# Patient Record
Sex: Female | Born: 1970 | Race: White | Hispanic: No | Marital: Single | Smoking: Current some day smoker
Health system: Southern US, Community
[De-identification: ages and names within clinical notes are randomized; demographics above are authoritative.]

## PROBLEM LIST (undated history)

## (undated) DIAGNOSIS — C3490 Malignant neoplasm of unspecified part of unspecified bronchus or lung: Secondary | ICD-10-CM

## (undated) DIAGNOSIS — R531 Weakness: Secondary | ICD-10-CM | POA: Insufficient documentation

## (undated) DIAGNOSIS — Z79899 Other long term (current) drug therapy: Secondary | ICD-10-CM | POA: Insufficient documentation

## (undated) DIAGNOSIS — R110 Nausea: Secondary | ICD-10-CM | POA: Insufficient documentation

## (undated) DIAGNOSIS — R5383 Other fatigue: Secondary | ICD-10-CM | POA: Insufficient documentation

## (undated) DIAGNOSIS — F17210 Nicotine dependence, cigarettes, uncomplicated: Secondary | ICD-10-CM | POA: Insufficient documentation

## (undated) DIAGNOSIS — C7970 Secondary malignant neoplasm of unspecified adrenal gland: Secondary | ICD-10-CM | POA: Insufficient documentation

## (undated) DIAGNOSIS — Z5111 Encounter for antineoplastic chemotherapy: Secondary | ICD-10-CM | POA: Insufficient documentation

## (undated) DIAGNOSIS — E039 Hypothyroidism, unspecified: Secondary | ICD-10-CM

## (undated) DIAGNOSIS — R05 Cough: Secondary | ICD-10-CM | POA: Insufficient documentation

## (undated) HISTORY — PX: CHOLECYSTECTOMY: SHX55

## (undated) HISTORY — PX: PORTACATH PLACEMENT: SHX2246

## (undated) HISTORY — DX: Malignant neoplasm of unspecified part of unspecified bronchus or lung: C34.90

---

## 2009-01-14 ENCOUNTER — Inpatient Hospital Stay: Payer: Self-pay | Admitting: General Surgery

## 2010-07-01 ENCOUNTER — Emergency Department: Payer: Self-pay | Admitting: Emergency Medicine

## 2014-04-27 ENCOUNTER — Emergency Department: Payer: Self-pay | Admitting: Emergency Medicine

## 2014-08-07 ENCOUNTER — Emergency Department: Payer: Self-pay | Admitting: Internal Medicine

## 2014-08-08 ENCOUNTER — Ambulatory Visit: Payer: Self-pay | Admitting: Oncology

## 2014-08-08 ENCOUNTER — Inpatient Hospital Stay: Payer: Self-pay | Admitting: Internal Medicine

## 2014-08-08 DIAGNOSIS — C3490 Malignant neoplasm of unspecified part of unspecified bronchus or lung: Secondary | ICD-10-CM

## 2014-08-08 HISTORY — DX: Malignant neoplasm of unspecified part of unspecified bronchus or lung: C34.90

## 2014-08-11 ENCOUNTER — Ambulatory Visit: Payer: Self-pay | Admitting: Internal Medicine

## 2014-08-16 ENCOUNTER — Ambulatory Visit: Payer: Self-pay | Admitting: Oncology

## 2014-08-18 ENCOUNTER — Ambulatory Visit: Payer: Self-pay | Admitting: Oncology

## 2014-08-24 ENCOUNTER — Ambulatory Visit
Admit: 2014-08-24 | Disposition: A | Discharge status: Home / Self Care | Payer: Self-pay | Attending: Oncology | Admitting: Oncology

## 2014-09-09 ENCOUNTER — Ambulatory Visit: Payer: Self-pay | Admitting: Vascular Surgery

## 2014-09-17 LAB — CBC CANCER CENTER
Basophil #: 0 x10 3/mm (ref 0.0–0.1)
Basophil %: 0.6 %
EOS PCT: 2.5 %
Eosinophil #: 0.2 x10 3/mm (ref 0.0–0.7)
HCT: 41.9 % (ref 35.0–47.0)
HGB: 14 g/dL (ref 12.0–16.0)
Lymphocyte #: 1.8 x10 3/mm (ref 1.0–3.6)
Lymphocyte %: 25.6 %
MCH: 30.5 pg (ref 26.0–34.0)
MCHC: 33.5 g/dL (ref 32.0–36.0)
MCV: 91 fL (ref 80–100)
MONO ABS: 0.4 x10 3/mm (ref 0.2–0.9)
Monocyte %: 6 %
NEUTROS PCT: 65.3 %
Neutrophil #: 4.7 x10 3/mm (ref 1.4–6.5)
PLATELETS: 288 x10 3/mm (ref 150–440)
RBC: 4.6 10*6/uL (ref 3.80–5.20)
RDW: 14 % (ref 11.5–14.5)
WBC: 7.2 x10 3/mm (ref 3.6–11.0)

## 2014-09-17 LAB — COMPREHENSIVE METABOLIC PANEL
ALT: 9 U/L — AB
AST: 15 U/L
Albumin: 3.5 g/dL
Alkaline Phosphatase: 75 U/L
Anion Gap: 7 (ref 7–16)
BUN: 9 mg/dL
Bilirubin,Total: 0.5 mg/dL
Calcium, Total: 8.7 mg/dL — ABNORMAL LOW
Chloride: 100 mmol/L — ABNORMAL LOW
Co2: 29 mmol/L
Creatinine: 0.57 mg/dL
EGFR (African American): >60
EGFR (Non-African Amer.): >60
Glucose: 90 mg/dL
POTASSIUM: 3.7 mmol/L
Sodium: 136 mmol/L
TOTAL PROTEIN: 7.2 g/dL

## 2014-09-22 LAB — BASIC METABOLIC PANEL
Anion Gap: 9 (ref 7–16)
BUN: 16 mg/dL
CO2: 31 mmol/L
CREATININE: 0.72 mg/dL
Calcium, Total: 9.9 mg/dL
Chloride: 97 mmol/L — ABNORMAL LOW
GLUCOSE: 109 mg/dL — AB
Potassium: 4.4 mmol/L
SODIUM: 137 mmol/L

## 2014-09-22 LAB — CBC CANCER CENTER
Basophil #: 0 x10 3/mm (ref 0.0–0.1)
Basophil %: 0.5 %
EOS PCT: 0.6 %
Eosinophil #: 0.1 x10 3/mm (ref 0.0–0.7)
HCT: 43 % (ref 35.0–47.0)
HGB: 14.4 g/dL (ref 12.0–16.0)
LYMPHS ABS: 1.4 x10 3/mm (ref 1.0–3.6)
LYMPHS PCT: 17.6 %
MCH: 30.6 pg (ref 26.0–34.0)
MCHC: 33.5 g/dL (ref 32.0–36.0)
MCV: 91 fL (ref 80–100)
MONOS PCT: 0.7 %
Monocyte #: 0.1 x10 3/mm — ABNORMAL LOW (ref 0.2–0.9)
NEUTROS ABS: 6.4 x10 3/mm (ref 1.4–6.5)
Neutrophil %: 80.6 %
Platelet: 210 x10 3/mm (ref 150–440)
RBC: 4.71 10*6/uL (ref 3.80–5.20)
RDW: 13.5 % (ref 11.5–14.5)
WBC: 8 x10 3/mm (ref 3.6–11.0)

## 2014-09-22 LAB — CREATININE, SERUM: Creatine, Serum: 0.72

## 2014-09-24 ENCOUNTER — Ambulatory Visit
Admit: 2014-09-24 | Disposition: A | Discharge status: Home / Self Care | Payer: Self-pay | Attending: Oncology | Admitting: Oncology

## 2014-10-06 LAB — COMPREHENSIVE METABOLIC PANEL
Albumin: 3.5 g/dL
Alkaline Phosphatase: 92 U/L
Anion Gap: 5 — ABNORMAL LOW (ref 7–16)
BUN: 7 mg/dL
Bilirubin,Total: 0.4 mg/dL
CALCIUM: 8.5 mg/dL — AB
CHLORIDE: 103 mmol/L
Co2: 27 mmol/L
Creatinine: 0.59 mg/dL
Glucose: 90 mg/dL
Potassium: 3.8 mmol/L
SGOT(AST): 16 U/L
SGPT (ALT): 13 U/L — ABNORMAL LOW
Sodium: 135 mmol/L
TOTAL PROTEIN: 7.2 g/dL

## 2014-10-06 LAB — CBC CANCER CENTER
Basophil #: 0 x10 3/mm (ref 0.0–0.1)
Basophil %: 0.8 %
EOS ABS: 0.1 x10 3/mm (ref 0.0–0.7)
Eosinophil %: 1.3 %
HCT: 39.9 % (ref 35.0–47.0)
HGB: 13.5 g/dL (ref 12.0–16.0)
Lymphocyte #: 1.7 x10 3/mm (ref 1.0–3.6)
Lymphocyte %: 40.6 %
MCH: 30.7 pg (ref 26.0–34.0)
MCHC: 33.8 g/dL (ref 32.0–36.0)
MCV: 91 fL (ref 80–100)
MONO ABS: 0.6 x10 3/mm (ref 0.2–0.9)
Monocyte %: 13.9 %
Neutrophil #: 1.8 x10 3/mm (ref 1.4–6.5)
Neutrophil %: 43.4 %
Platelet: 357 x10 3/mm (ref 150–440)
RBC: 4.38 10*6/uL (ref 3.80–5.20)
RDW: 14.5 % (ref 11.5–14.5)
WBC: 4.1 x10 3/mm (ref 3.6–11.0)

## 2014-10-13 LAB — CBC CANCER CENTER
BASOS PCT: 1.3 %
Basophil #: 0.1 x10 3/mm (ref 0.0–0.1)
Eosinophil #: 0 x10 3/mm (ref 0.0–0.7)
Eosinophil %: 0.4 %
HCT: 40.2 % (ref 35.0–47.0)
HGB: 13.6 g/dL (ref 12.0–16.0)
LYMPHS ABS: 2.1 x10 3/mm (ref 1.0–3.6)
Lymphocyte %: 30.9 %
MCH: 30.7 pg (ref 26.0–34.0)
MCHC: 33.7 g/dL (ref 32.0–36.0)
MCV: 91 fL (ref 80–100)
MONO ABS: 0.1 x10 3/mm — AB (ref 0.2–0.9)
MONOS PCT: 1.3 %
Neutrophil #: 4.6 x10 3/mm (ref 1.4–6.5)
Neutrophil %: 66.1 %
PLATELETS: 272 x10 3/mm (ref 150–440)
RBC: 4.42 10*6/uL (ref 3.80–5.20)
RDW: 14.3 % (ref 11.5–14.5)
WBC: 6.9 x10 3/mm (ref 3.6–11.0)

## 2014-10-17 ENCOUNTER — Other Ambulatory Visit: Payer: Self-pay | Admitting: Oncology

## 2014-10-17 DIAGNOSIS — C3492 Malignant neoplasm of unspecified part of left bronchus or lung: Secondary | ICD-10-CM

## 2014-10-17 DIAGNOSIS — C3490 Malignant neoplasm of unspecified part of unspecified bronchus or lung: Secondary | ICD-10-CM | POA: Insufficient documentation

## 2014-10-18 ENCOUNTER — Other Ambulatory Visit: Payer: Self-pay | Admitting: Oncology

## 2014-10-24 NOTE — H&P (Signed)
PATIENT NAME:  Tammy Andersen, Tammy Andersen MR#:  [id number] DATE OF BIRTH:  12/13/1970  DATE OF ADMISSION:  08/08/2014  REFERRING PHYSICIAN: Sheryl L. Benjaman Lobe, MD  PRIMARY CARE PRACTITIONER: None.    ADMITTING PHYSICIAN: Juluis Mire, MD   CHIEF COMPLAINT: Coughing up blood of 2 days' duration.   HISTORY OF PRESENT ILLNESS: A 44 year old Caucasian woman with no significant past medical history except for a history of smoking for the past many years,  presents to the Emergency Room with complaints of a dry cough with associated coughing up blood which started about 2 days ago. The patient presented to the Emergency Room yesterday and was evaluated by the ED physician for cough with associated hemoptysis and found to have a left  lung mass on chest x-ray, following which she underwent a CT of the chest which revealed left lung hilar massive lymphadenopathy. She was advised admission yesterday, but she signed [place] and left the hospital. Of note, she was found to be hemodynamically stable and labs including hemoglobin and hematocrit were within normal limits. ED physician yesterday consulted the oncologist on-call, Dr. Grayland Ormond, with whom the patient has an appointment to see in his office tomorrow. Meanwhile, today, she was really worried about if she has further worsening of symptoms; hence, she came to the Emergency Room for further evaluation. Did not have any increased cough or increased hemoptysis today. She does have just minor coughing up of blood and 2 or 3 episodes of a dry cough today. Denies any dizziness. No chest pain. No shortness of breath. No recent fever. No chills. No nausea, no vomiting. No diarrhea. No urinary symptoms. In the Emergency Room, the patient was evaluated by the ED physician again today and was found to be hemodynamically stable, and repeat lab work was unremarkable. There is no further drop in hemoglobin and hematocrit. In view of her significant findings on the  CT chest with a massive left hilar adenopathy and the risk of eminent  hemoptysis, hospitalist service was consulted for further evaluation and management.    PAST MEDICAL HISTORY: No history of hypertension, diabetes, or lung problems. History of smoking.   PAST SURGICAL HISTORY: Cholecystectomy.   HOME MEDICATIONS: None.   ALLERGIES: No known drug allergies.   FAMILY HISTORY: Significant for diabetes and heart disease which runs in the family. No history of any cancers in the family.   SOCIAL HISTORY: She is single and is unemployed. History of smoking 1/2 pack per day for the past many years. Denies any history of alcohol or substance abuse.   REVIEW OF SYSTEMS:   CONSTITUTIONAL: Negative for fever or chills. No fatigue. No generalized weakness.  EYES: Negative for blurred vision, double vision. No pain. No redness. No discharge.  EARS, NOSE, AND THROAT: Negative for tinnitus, ear pain, hearing loss, epistaxis, nasal discharge, or difficulty swallowing.  RESPIRATORY: Positive for dry cough with bouts of hemoptysis for the past 2 days as mentioned in the history of present illness. Denies any wheezing. No sputum production. No painful respirations.  CARDIOVASCULAR: Negative for chest pain, palpitations, dizziness, syncopal episodes, orthopnea, dyspnea on exertion, or pedal edema.  GASTROINTESTINAL: Negative for nausea, vomiting, diarrhea, abdominal pain, hematemesis, melena, rectal bleeding, or GERD symptoms.  GENITOURINARY: Negative for dysuria, frequency, urgency, or hematuria.  HEMATOLOGIC: Negative for anemia, easy bruising, or swollen glands.  INTEGUMENTARY: Negative for acne, skin rash, or lesions.  MUSCULOSKELETAL: Negative for neck pain or shoulder pain. No arthritis. No history of gout.  NEUROLOGICAL:  Negative for focal weakness or numbness. No history of CVA, TIA, or seizure disorder.  PSYCHIATRIC: Negative for anxiety, insomnia, or depression.   PHYSICAL EXAMINATION:  VITAL  SIGNS: Temperature 98.5 degrees Fahrenheit, pulse rate 107 per minute, respirations 20 per minute, blood pressure 139/83, O2 saturation is 95% on room air.  GENERAL: Well-developed, well-nourished, alert and oriented, in no acute distress, comfortably resting in the bed.  HEAD: Atraumatic, normocephalic.  EYES: Pupils are equal, react to light and accommodation. No conjunctival pallor. No icterus. Extraocular movements intact.  NOSE: No drainage. No lesions.  EARS: No drainage. No external lesions.  ORAL CAVITY: No mucosal lesions. No exudates.   NECK: Supple. No JVD. No thyromegaly. No carotid bruit. Range of motion of neck within normal limits.  RESPIRATORY: Good respiratory effort. Not using accessory muscles of respiration. Occasional rhonchi bilaterally present. No rales.  CARDIOVASCULAR: S1, S2 regular. No murmurs, gallops, or clicks. Pulses equal at carotid, femoral, and pedal pulses. No peripheral edema.  GASTROINTESTINAL: Abdomen is soft and nontender. No hepatosplenomegaly. No masses. No rigidity. No guarding. Bowel sounds are present and equal in all 4 quadrants.  GENITOURINARY: Deferred. MUSCULOSKELETAL: No joint tenderness or effusion. Range of motion adequate. Strength and tone equal bilaterally.  SKIN: Inspection within normal limits. No obvious wounds. LYMPHATIC: No cervical lymphadenopathy.   VASCULAR: Good dorsalis pedis and posterior tibial pulses.  NEUROLOGICAL: Alert, awake, and oriented x 3. Cranial nerves II through XII grossly intact. No sensory deficit or motor deficit. Motor strength 5/5 in both upper and lower extremities. DTRs 2+ bilateral and symmetrical.  PSYCHIATRIC: Alert, awake, and oriented x 3. Judgment and insight adequate. Memory and mood within normal limits.   LABORATORY DATA: Serum glucose 79, BUN 6, creatinine 0.67, sodium 135, potassium 3.6, chloride 99, bicarbonate 29, total calcium 8.5, total protein 7.9, albumin 2.7, total bilirubin 0.5, alkaline  phosphatase 92, AST 22, ALT 14. WBC 11.2, hemoglobin 14.0, hematocrit 42.1, platelet count 257,000. Prothrombin time 13.4, INR 1.0.   Chest x-ray done yesterday: A large mass-like area of consolidation within the left perihilar region, may represent pneumonia in the appropriate clinical setting. Left hilar/pulmonary mass is an additional consideration. Recommend further evaluation with chest CT.    CT of the chest with contrast:  1.  Extensive left hilar and mediastinal bulky adenopathy with encasement and narrowing of the left pulmonary artery branches.  2.  Parenchymal changes in the left upper lobe and lingula suggesting possible lymphangitic spread of tumor versus postoperative changes.  3.  Nodules along the left major fissure and small left pleural effusions suggesting malignant pleural disease.  4.  Abnormal left adrenal gland incompletely visualized. Consider PET CT for further evaluation and staging.   ASSESSMENT AND PLAN: A 44 year old Caucasian female with a past medical history significant for active smoking who presents with complaints of dry cough with associated hemoptysis of 2 days' duration, evaluated by the Emergency Department physician and found to have left hilar massive adenopathy concerning for possible malignancy. The patient is hemodynamically stable.  1.  Left lung hilar lymphadenopathy suspicious for malignancy.  2.  Hemoptysis with associated dry cough of 2 days' duration. The patient is hemodynamically stable. Hemoglobin and hematocrit within normal limits. No active hemoptysis at the current time.  3.  Tobacco usage, continuous.   PLAN: Admit to medical floor. Monitor vital signs closely. Monitor for any hemoptysis. Type and screen. Serial CBC q. 6 hours. DuoNebs p.r.n. for shortness of breath or wheezing. Pulmonary consultation and oncologic consultation  requested for further evaluation. Counseled to quit smoking. Offered nicotine replacement treatment. The patient  defers at this time.  Deep vein thrombosis prophylaxis: SCDs. No heparin in view of hemoptysis. GI prophylaxis: PPI.   CODE STATUS: FULL CODE.   TIME SPENT: 50 minutes.    ____________________________ Juluis Mire, MD enr:ts D: 08/08/2014 21:03:26 ET T: 08/08/2014 21:36:52 ET JOB#: [id number]  cc: Juluis Mire, MD, <Dictator> Juluis Mire MD ELECTRONICALLY SIGNED 08/09/2014 12:45

## 2014-10-24 NOTE — Discharge Summary (Signed)
PATIENT NAME:  Tammy Andersen, Tammy Andersen MR#:  [id number] DATE OF BIRTH:  [birth year]  DATE OF ADMISSION:  08/08/2014 DATE OF DISCHARGE:  08/09/2014  ADMITTING DIAGNOSIS: Coughing of blood.   DISCHARGE DIAGNOSES:  1.  Hemoptysis due to likely lung cancer. Her. We will have an outpatient bronchoscopy.  2.  Nicotine addiction.  3.  Left hilar massive lymphadenopathy suspicious for malignancy.   CONSULTANTS: Mariane Duval, MD  PERTINENT LABORATORIES AND EVALUATIONS: Admitting glucose 82, BUN 6, creatinine 0.68, sodium 136, potassium was 3.9, chloride 102, CO2 of 29, calcium 8.7. LFTs were normal except albumin of 2.7. WBC 10.0, hemoglobin 14.2, platelet count was 260,000. INR 1.0. CT scan of showed extensive left hilar and mediastinal bulky adenopathy with encasement and narrowing of the left pulmonary artery branches, parenchymal changes in the left upper lobe and lingula suggesting a possible lymphangitic spread of tumor versus postobstructive changes. Nodule along with left major fissure and small left effusion suggestive of malignant pleural disease. Abnormal left adrenal gland pain incompletely visualize.   HOSPITAL COURSE: Please refer to H and P done by the admitting physician. The patient is a 44 year old white female who presented with 2-day history of coughing up blood. She was evaluated in the ER with chest x-ray as well as a CT scan of the chest. Findings were worrisome for a lung cancer. The patient was initially seen in the ED with these complaints she AMA, and then came back. This time, she was admitted, again. After being hospitalized overnight, the next day when I went to see the patient, she was very anxious to go home. I recommended that she stay until she was seen by the pulmonary doctor. She was seen by Dr. Mortimer Fries. He stated that she will be having a bronchoscopy on Wednesday due to anesthesiology schedule. The patient did not want to wait; she wanted to go home. We strongly recommended that she  follow up for the bronchoscopy on Wednesday. At this time, due to her wishes, the patient is being discharged. She has not had any further hemoptysis.   DISCHARGE MEDICATIONS: Levofloxacin 750 one tablet q. 24 x 5 days, Tylenol 650 q. 4 p.r.n. for pain.   DIET: Regular.   ACTIVITY: As tolerated.   FOLLOWUP: On Wednesday with Dr. Mortimer Fries for bronchoscopy. Follow up with oncology in 1-2 weeks.   TIME SPENT ON THIS DISCHARGE: 35 minutes.    ____________________________ Lafonda Mosses Posey Pronto, MD shp:bm D: 08/10/2014 20:42:07 ET T: 08/11/2014 03:07:48 ET JOB#: [id number]  cc: Chavonne Sforza H. Posey Pronto, MD, <Dictator> Alric Seton MD ELECTRONICALLY SIGNED 08/13/2014 15:55

## 2014-10-24 NOTE — Consult Note (Signed)
Reason for Visit: This 44 year old Female patient presents to the clinic for initial evaluation of  lung cancer .   Referred by Dr. Grayland Ormond.  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 She also had nodules along the left major fissure as well as small left pleural effusion suggesting malignant pleural effusion. CT scan confirmed hypermetabolic activity in these regions as well as left adrenal gland.Fransisco Beau was positive for small cell lung cancer. She's had some slight tinged sputum blood-tinged sputum. She's lost about 30 pounds over the past several months. I've asked to evaluate her for possible palliative radiation therapy.  Past Hx:    Denies:    Cholecystectomy: 14-Jan-2009  Past, Family and Social History:  Past Medical History positive   Past Surgical History cholecystectomy   Family History noncontributory   Social History positive   Social History Comments greater than 50-pack-year smoking history no EtOH abuse history   Additional Past Medical and Surgical History computed by family member today   Allergies:   No Known Allergies:   Home Meds:  Home Medications: Medication Instructions Status  Tussionex [employer] 10 mg-8 mg/5 mL oral suspension, extended release 5 milliliter(s) orally every 12 hours Active   Review of Systems:  General negative    Performance Status (ECOG) 0   Skin negative   Breast negative   Ophthalmologic negative   ENMT negative   Respiratory and Thorax see HPI   Cardiovascular negative   Gastrointestinal negative   Genitourinary negative   Musculoskeletal negative   Neurological negative   Psychiatric negative   Hematology/Lymphatics negative   Endocrine negative   Allergic/Immunologic negative   Review of Systems   denies any weight loss, fatigue, weakness, fever, chills or night sweats. Patient denies any loss of vision, blurred vision. Patient denies any ringing  of the ears or hearing loss. No irregular heartbeat. Patient denies heart murmur or history of fainting. Patient denies any chest pain or pain radiating to her upper extremities. Patient denies any shortness of breath, difficulty breathing at night, cough or hemoptysis. Patient denies any swelling in the lower legs. Patient denies any nausea vomiting, vomiting of blood, or coffee ground material in the vomitus. Patient denies any stomach pain. Patient states has had normal bowel movements no significant constipation or diarrhea. Patient denies any dysuria, hematuria or significant nocturia. Patient denies any problems walking, swelling in the joints or loss of balance. Patient denies any skin changes, loss of hair or loss of weight. Patient denies any excessive worrying or anxiety or significant depression. Patient denies any problems with insomnia. Patient denies excessive thirst, polyuria, polydipsia. Patient denies any swollen glands, patient denies easy bruising or easy bleeding. Patient denies any recent infections, allergies or URI. Patient "s visual fields have not changed significantly in recent time.   Nursing Notes:  Nursing Vital Signs and Chemo Nursing Nursing Notes: *CC Vital Signs Flowsheet:   07-Mar-16 10:29  Temp Temperature 98.1  Pulse Pulse 92  Respirations Respirations 20  SBP SBP 138  DBP DBP 85  Pain Scale (0-10)   0  Pulse Oxi  96  Current Weight (kg) (kg) 88.3  Height (cm) centimeters 157.2  BSA (m2) 1.8   Physical Exam:  General/Skin/HEENT:  General normal   Skin normal   Eyes normal   ENMT normal   Head and Neck normal   Additional PE well-developed female so slightly obese in NAD. T thirst poor state of repair no oral mucosal lesions are identified is clear without evidence of cervical or supraclavicular adenopathy. Lungs are clear to A&P cardiac examination shows regular rate and rhythm. Abdomen is benign.   Breasts/Resp/CV/GI/GU:  Respiratory and Thorax normal   Cardiovascular normal   Gastrointestinal normal   Genitourinary normal   MS/Neuro/Psych/Lymph:  Musculoskeletal normal   Neurological normal   Lymphatics normal   Other Results:  Radiology Results: LabUnknown:    13-Feb-16 11:37, CT Chest With Contrast  PACS Image   CT:  CT Chest With Contrast   REASON FOR EXAM:    cough bloody sputum cp lul mass on xray  COMMENTS:       PROCEDURE: CT  - CT CHEST WITH CONTRAST  - Aug 07 2014 11:37AM     CLINICAL DATA:  left sided shoulder pain and left sided upper CP X 4  days. Worse with movement and coughing. Pt with cough and bloddy  sputum. Chest x-ray from today recommended follow up CT scan. Pt  denies hx of surgery, trauma, or ca. Pt states she has been a pack a  day smoker for 20+ years    EXAM:  CT CHEST WITH CONTRAST    TECHNIQUE:  Multidetector CT imaging of the chest was performed during  intravenous contrast administration.    CONTRAST:  75 mL Omnipaque 350 IV    COMPARISON:  Radiograph from earlier the same day    FINDINGS:  Extensive bulky left hilar, anterior mediastinal, subcarinal, and  right paratracheal adenopathy. The hilar disease results in  narrowing of left pulmonary artery branches. Trace left pleural  effusion. Two nodules along the left major fissure, largest 15 mm  image 26/3. No pericardial effusion. Interstitial  thickening  peripherally in the left upper lobe and lingula with patchy  suprahilar airspace and alveolar opacities. Dependent atelectasis  posteriorly at the left lung base. Right lung is clear. Mild  spondylitic changes in the lower thoracic spine. Sternum intact.  There is mild enlargement of the left adrenal gland with mild  adjacent edematous/inflammatory change, incompletely visualized.  Surgical clips in the gallbladder fossa. Remainder visualized upper  abdomen unremarkable.     IMPRESSION:  1. Extensive left hilar and mediastinal bulky adenopathy, with  encasement and narrowing of left pulmonary artery branches.  2. Parenchymal changes in the left upper lobe and lingula suggesting  possible lymphangitic spread of tumor versus postobstructive change.  3. Nodules along the left major fissure and small left pleural  effusion suggesting malignant pleural disease.  4. Abnormal left adrenal gland, incompletely visualized. Consider  PET-CT for further evaluation and staging.      Electronically Signed    By: Lucrezia Europe M.D.    On: 08/07/2014 12:07         Verified By: Kandis Cocking, M.D.,  Nuclear Med:    [phone] 14:28, PET/CT Staging Lung/Small Cell  PET/CT Staging Lung/Small Cell   REASON FOR EXAM:    initial stage small cell lung cancer  COMMENTS:  PROCEDURE: PET - PET/CT STAGING LUNG/SMALL CELL  - Aug 18 2014  2:28PM     CLINICAL DATA:  Initial treatment strategy for stage IIIB small cell  lung carcinoma.    EXAM:  NUCLEAR MEDICINE PET SKULL BASE TO THIGH    TECHNIQUE:  13.2 mCi F-18 FDG was injected intravenously. Full-ring PET imaging  was performed from the skull base to thigh after the radiotracer. CT  data was obtained and used for attenuation correction and anatomic  localization.    FASTING BLOOD GLUCOSE:  Value: 91 mg/dl    COMPARISON:  CT chest from 08/07/2014.    FINDINGS:  NECK    There is no hypermetabolic lymphadenopathy in the neck.  Diffuse FDG  uptake is identified in both lobes of the thyroid gland may be  related to thyroiditis.    CHEST  Previously described bulky mediastinal and left hilar  lymphadenopathy is markedly hypermetabolic. Representative SUV for  anterior mediastinal lymphadenopathy is 23.9. Subcarinal  lymphadenopathy demonstrates SUV max = 15.9. Left infrahilar disease  demonstrates SUV max = 14.9.    ABDOMEN/PELVIS    1.7 cm left adrenal nodule is markedly hypermetabolic with SUV max =  9.2. Right adrenal gland is normal. Mild fold FDG uptake is  identified in the liver parenchyma, but no discrete or highly  suspicious focal liver lesion is evident. No other evidence for  visceral metastases within the abdomen.    Atherosclerotic calcification is seen in the wall of the abdominal  aorta without aneurysm.    5.4 x 4.6 cm cystic mass identified in the left adnexal space  demonstrates no hypermetabolism on PET imaging.    SKELETON    No focal hypermetabolic activity to suggest skeletal metastasis.     IMPRESSION:  Markedly hypermetabolic lymphadenopathy of the mediastinum and hilum  consistent with the patient's known malignancy. There is associated  hypermetabolic 1.7 cm left adrenal nodule, compatible with  metastatic disease to the abdomen.  5.4 cm left adnexal cystic lesion without hypermetabolism. Pelvic  ultrasound may prove helpful to further evaluate.      Electronically Signed    By: Misty Stanley M.D.    On: 08/18/2014 17:28         Verified By: ERIC A. MANSELL, M.D.,   Relevent Results:   Relevant Scans and Labs PET/CT scan and CT scans reviewed MRI of brain scheduled   Assessment and Plan: Impression:   stage IV small cell lung cancer in 44 year old female Plan:   at this time based on the extensive nature of her disease a single field of radiation would cover X significant amount of normal lung volume. I am concerned about the tumor encroaching the left pulmonary  artery.although I believe in stage IV disease at this point we should go ahead with initiation of systemic chemotherapy evaluate the response in the next month or 2 and possibly that time offer some palliative radiation therapy and a short course. Certainly should she continue to bleed or have major obstruction of her lung could offer palliative radiation therapy sooner. All this was explained in detail to the patient and her family member. I have set her up for 2 month follow-up appointment. She also will have her brain scan this week and will follow up on that. Have discussed the case personally with medical oncology.  I would like to take this opportunity for allowing me to participate in the care of your patient..  Electronic Signatures: Armstead Peaks (MD)  (Signed [phone]  15:13)  Authored: HPI, Diagnosis, Past Hx, PFSH, Allergies, Home Meds, ROS, Nursing Notes, Physical Exam, Other Results, Relevent Results, Encounter Assessment and Plan   Last Updated: 07-Mar-16 15:39 by Armstead Peaks (MD)

## 2014-10-24 NOTE — Op Note (Signed)
PATIENT NAME:  Tammy Andersen, Tammy Andersen MR#:  [id number] DATE OF BIRTH:  1971/05/05  DATE OF PROCEDURE:  09/09/2014  PREOPERATIVE DIAGNOSIS:  Lung cancer with poor venous access.  POSTOPERATIVE DIAGNOSIS:  Lung cancer with poor venous access.  PROCEDURES:  1.  Ultrasound guidance for vascular access, right internal jugular vein.  2.  Fluoroscopic guidance for placement of catheter.  3.  Placement of CT compatible Port-A-Cath, right internal jugular vein.   SURGEON:  Algernon Huxley, M.D.   ANESTHESIA:  Local with moderate conscious sedation.   FLUOROSCOPY TIME:  Less than 1 minute.   CONTRAST:  Zero.   ESTIMATED BLOOD LOSS:  25 mL.   INDICATION FOR PROCEDURE:  A 44 year old female with lung cancer who needs Port-a-Cath for chemotherapy access and durable venous access. Risks and benefits were discussed. Informed consent was obtained.   DESCRIPTION OF THE PROCEDURE:  The patient was brought to the vascular and interventional radiology suite. The right neck and chest were sterilely prepped and draped, and a sterile surgical field was created. Ultrasound was used to help visualize a patent right internal jugular vein. This was then accessed under direct ultrasound guidance without difficulty with a Seldinger needle and a permanent image was recorded. A J-wire was placed. After skin nick and dilatation, the peel-away sheath was then placed over the wire. I then anesthetized an area under the clavicle approximately 2 fingerbreadths. A transverse incision was created and an inferior pocket was created with electrocautery and blunt dissection. The port was then brought onto the field, placed into the pocket and secured to the chest wall with 2 Prolene sutures. The catheter was connected to the port and tunneled from the subclavicular incision to the access site. Fluoroscopic guidance was used to cut the catheter to an appropriate length. The catheter was then placed through the peel-away sheath and the peel-away  sheath was removed. The catheter tip was parked in excellent location in the right atrium just beyond the cavoatrial junction. The pocket was then irrigated with antibiotic-impregnated saline and the wound was closed with a running 3-0 Vicryl and a 4-0 Monocryl. The access incision was closed with a single 4-0 Monocryl. The Huber needle was used to withdraw blood and flush the port with heparinized saline. Dermabond was then placed as a dressing. The patient tolerated the procedure well and was taken to the recovery room in stable condition.    ____________________________ Algernon Huxley, MD jsd:sp D: 09/09/2014 12:26:28 ET T: 09/09/2014 13:41:55 ET JOB#: [id number]  cc: Algernon Huxley, MD, <Dictator> Algernon Huxley MD ELECTRONICALLY SIGNED 09/13/2014 15:03

## 2014-10-27 ENCOUNTER — Ambulatory Visit: Payer: Medicaid Other | Admitting: Oncology

## 2014-10-27 ENCOUNTER — Inpatient Hospital Stay: Payer: Medicaid Other

## 2014-10-27 ENCOUNTER — Ambulatory Visit: Payer: Self-pay | Admitting: *Deleted

## 2014-10-28 ENCOUNTER — Ambulatory Visit: Payer: Medicaid Other

## 2014-10-29 ENCOUNTER — Ambulatory Visit: Payer: Self-pay

## 2014-11-02 ENCOUNTER — Other Ambulatory Visit: Payer: Self-pay | Admitting: Oncology

## 2014-11-02 DIAGNOSIS — C3492 Malignant neoplasm of unspecified part of left bronchus or lung: Secondary | ICD-10-CM

## 2014-11-03 ENCOUNTER — Inpatient Hospital Stay (HOSPITAL_BASED_OUTPATIENT_CLINIC_OR_DEPARTMENT_OTHER): Payer: Medicaid Other | Admitting: Oncology

## 2014-11-03 ENCOUNTER — Inpatient Hospital Stay: Payer: Medicaid Other

## 2014-11-03 ENCOUNTER — Inpatient Hospital Stay: Payer: Medicaid Other | Attending: Oncology

## 2014-11-03 ENCOUNTER — Encounter: Payer: Self-pay | Admitting: Oncology

## 2014-11-03 VITALS — BP 136/95 | HR 88 | Temp 96.5°F | Resp 20 | Wt 193.1 lb

## 2014-11-03 DIAGNOSIS — C3490 Malignant neoplasm of unspecified part of unspecified bronchus or lung: Secondary | ICD-10-CM

## 2014-11-03 DIAGNOSIS — R5383 Other fatigue: Secondary | ICD-10-CM

## 2014-11-03 DIAGNOSIS — Z79899 Other long term (current) drug therapy: Secondary | ICD-10-CM | POA: Diagnosis not present

## 2014-11-03 DIAGNOSIS — R05 Cough: Secondary | ICD-10-CM

## 2014-11-03 DIAGNOSIS — C7970 Secondary malignant neoplasm of unspecified adrenal gland: Secondary | ICD-10-CM

## 2014-11-03 DIAGNOSIS — R531 Weakness: Secondary | ICD-10-CM | POA: Diagnosis not present

## 2014-11-03 DIAGNOSIS — C3492 Malignant neoplasm of unspecified part of left bronchus or lung: Secondary | ICD-10-CM

## 2014-11-03 DIAGNOSIS — F17210 Nicotine dependence, cigarettes, uncomplicated: Secondary | ICD-10-CM | POA: Diagnosis not present

## 2014-11-03 DIAGNOSIS — R110 Nausea: Secondary | ICD-10-CM | POA: Diagnosis not present

## 2014-11-03 DIAGNOSIS — Z5111 Encounter for antineoplastic chemotherapy: Secondary | ICD-10-CM | POA: Diagnosis present

## 2014-11-03 LAB — COMPREHENSIVE METABOLIC PANEL
ALBUMIN: 3.4 g/dL — AB (ref 3.5–5.0)
ALT: 10 U/L — AB (ref 14–54)
AST: 19 U/L (ref 15–41)
Alkaline Phosphatase: 86 U/L (ref 38–126)
Anion gap: 6 (ref 5–15)
BILIRUBIN TOTAL: 0.3 mg/dL (ref 0.3–1.2)
BUN: 9 mg/dL (ref 6–20)
CHLORIDE: 102 mmol/L (ref 101–111)
CO2: 26 mmol/L (ref 22–32)
Calcium: 8.3 mg/dL — ABNORMAL LOW (ref 8.9–10.3)
Creatinine, Ser: 0.67 mg/dL (ref 0.44–1.00)
GFR calc Af Amer: >60 mL/min (ref >60)
GFR calc non Af Amer: >60 mL/min (ref >60)
Glucose, Bld: 161 mg/dL — ABNORMAL HIGH (ref 65–99)
Potassium: 3.4 mmol/L — ABNORMAL LOW (ref 3.5–5.1)
Sodium: 134 mmol/L — ABNORMAL LOW (ref 135–145)
Total Protein: 7 g/dL (ref 6.5–8.1)

## 2014-11-03 LAB — CBC WITH DIFFERENTIAL/PLATELET
BASOS PCT: 1 %
Basophils Absolute: 0.1 10*3/uL (ref 0–0.1)
Eosinophils Absolute: 0.1 10*3/uL (ref 0–0.7)
Eosinophils Relative: 1 %
HEMATOCRIT: 39.9 % (ref 35.0–47.0)
HEMOGLOBIN: 13.3 g/dL (ref 12.0–16.0)
Lymphocytes Relative: 21 %
Lymphs Abs: 2.2 10*3/uL (ref 1.0–3.6)
MCH: 30.5 pg (ref 26.0–34.0)
MCHC: 33.3 g/dL (ref 32.0–36.0)
MCV: 91.7 fL (ref 80.0–100.0)
MONO ABS: 0.6 10*3/uL (ref 0.2–0.9)
Monocytes Relative: 6 %
NEUTROS ABS: 7.6 10*3/uL — AB (ref 1.4–6.5)
Neutrophils Relative %: 71 %
PLATELETS: 314 10*3/uL (ref 150–440)
RBC: 4.36 MIL/uL (ref 3.80–5.20)
RDW: 16.4 % — AB (ref 11.5–14.5)
WBC: 10.6 10*3/uL (ref 3.6–11.0)

## 2014-11-03 MED ORDER — SODIUM CHLORIDE 0.9 % IV SOLN
Freq: Once | INTRAVENOUS | Status: AC
  Administered 2014-11-03: 12:00:00 via INTRAVENOUS
  Filled 2014-11-03: qty 250

## 2014-11-03 MED ORDER — HEPARIN SOD (PORK) LOCK FLUSH 100 UNIT/ML IV SOLN
500.0000 [IU] | Freq: Once | INTRAVENOUS | Status: AC
  Administered 2014-11-03: 500 [IU] via INTRAVENOUS

## 2014-11-03 MED ORDER — HEPARIN SOD (PORK) LOCK FLUSH 100 UNIT/ML IV SOLN
INTRAVENOUS | Status: AC
  Filled 2014-11-03: qty 5

## 2014-11-03 MED ORDER — POTASSIUM CHLORIDE 2 MEQ/ML IV SOLN
Freq: Once | INTRAVENOUS | Status: AC
  Administered 2014-11-03: 12:00:00 via INTRAVENOUS
  Filled 2014-11-03: qty 1000

## 2014-11-03 MED ORDER — SODIUM CHLORIDE 0.9 % IV SOLN
80.0000 mg/m2 | Freq: Once | INTRAVENOUS | Status: AC
  Administered 2014-11-03: 155 mg via INTRAVENOUS
  Filled 2014-11-03: qty 155

## 2014-11-03 MED ORDER — ETOPOSIDE CHEMO INJECTION 1 GM/50ML
80.0000 mg/m2 | Freq: Once | INTRAVENOUS | Status: AC
  Administered 2014-11-03: 160 mg via INTRAVENOUS
  Filled 2014-11-03: qty 8

## 2014-11-03 MED ORDER — PALONOSETRON HCL INJECTION 0.25 MG/5ML
0.2500 mg | Freq: Once | INTRAVENOUS | Status: AC
  Administered 2014-11-03: 0.25 mg via INTRAVENOUS
  Filled 2014-11-03: qty 5

## 2014-11-03 MED ORDER — SODIUM CHLORIDE 0.9 % IV SOLN
Freq: Once | INTRAVENOUS | Status: AC
  Administered 2014-11-03: 14:00:00 via INTRAVENOUS
  Filled 2014-11-03: qty 5

## 2014-11-03 NOTE — Progress Notes (Signed)
Patient reports that she has recently been evicted from her home due to being behind on rent.  [employer] [facility] did try to help patient with her rent but the landlord would not accept payment.  She has an appointment next week with [facility] program and is staying with friends in the mean time.

## 2014-11-04 ENCOUNTER — Inpatient Hospital Stay: Payer: Medicaid Other

## 2014-11-04 VITALS — BP 119/78 | HR 81 | Temp 96.1°F | Resp 20

## 2014-11-04 DIAGNOSIS — C3492 Malignant neoplasm of unspecified part of left bronchus or lung: Secondary | ICD-10-CM

## 2014-11-04 DIAGNOSIS — Z5111 Encounter for antineoplastic chemotherapy: Secondary | ICD-10-CM | POA: Diagnosis not present

## 2014-11-04 MED ORDER — HEPARIN SOD (PORK) LOCK FLUSH 100 UNIT/ML IV SOLN
500.0000 [IU] | Freq: Once | INTRAVENOUS | Status: AC | PRN
  Administered 2014-11-04: 500 [IU]
  Filled 2014-11-04: qty 5

## 2014-11-04 MED ORDER — SODIUM CHLORIDE 0.9 % IV SOLN
Freq: Once | INTRAVENOUS | Status: AC
  Administered 2014-11-04: 14:00:00 via INTRAVENOUS
  Filled 2014-11-04: qty 1000

## 2014-11-04 MED ORDER — SODIUM CHLORIDE 0.9 % IV SOLN
80.0000 mg/m2 | Freq: Once | INTRAVENOUS | Status: AC
  Administered 2014-11-04: 160 mg via INTRAVENOUS
  Filled 2014-11-04: qty 8

## 2014-11-04 MED ORDER — SODIUM CHLORIDE 0.9 % IV SOLN
10.0000 mg | Freq: Once | INTRAVENOUS | Status: AC
  Administered 2014-11-04: 10 mg via INTRAVENOUS
  Filled 2014-11-04: qty 1

## 2014-11-05 ENCOUNTER — Inpatient Hospital Stay: Payer: Medicaid Other

## 2014-11-05 VITALS — BP 122/78 | HR 77 | Temp 97.5°F | Resp 18

## 2014-11-05 DIAGNOSIS — C3492 Malignant neoplasm of unspecified part of left bronchus or lung: Secondary | ICD-10-CM

## 2014-11-05 DIAGNOSIS — Z5111 Encounter for antineoplastic chemotherapy: Secondary | ICD-10-CM | POA: Diagnosis not present

## 2014-11-05 MED ORDER — HEPARIN SOD (PORK) LOCK FLUSH 100 UNIT/ML IV SOLN
500.0000 [IU] | Freq: Once | INTRAVENOUS | Status: AC | PRN
  Administered 2014-11-05: 500 [IU]
  Filled 2014-11-05: qty 5

## 2014-11-05 MED ORDER — SODIUM CHLORIDE 0.9 % IV SOLN
80.0000 mg/m2 | Freq: Once | INTRAVENOUS | Status: AC
  Administered 2014-11-05: 160 mg via INTRAVENOUS
  Filled 2014-11-05: qty 8

## 2014-11-05 MED ORDER — SODIUM CHLORIDE 0.9 % IV SOLN
Freq: Once | INTRAVENOUS | Status: AC
  Administered 2014-11-05: 14:00:00 via INTRAVENOUS
  Filled 2014-11-05: qty 250

## 2014-11-05 MED ORDER — SODIUM CHLORIDE 0.9 % IJ SOLN
10.0000 mL | INTRAMUSCULAR | Status: DC | PRN
  Administered 2014-11-05: 10 mL
  Filled 2014-11-05: qty 10

## 2014-11-05 MED ORDER — SODIUM CHLORIDE 0.9 % IV SOLN
10.0000 mg | Freq: Once | INTRAVENOUS | Status: AC
  Administered 2014-11-05: 10 mg via INTRAVENOUS
  Filled 2014-11-05: qty 1

## 2014-11-23 NOTE — Progress Notes (Signed)
[facility]  Telephone:(336) [phone] Fax:(336) [phone]  ID: Tammy Andersen OB: 21-Dec-1970  MR#: [id number]  KGU#:[id number]  Patient Care Team: Marden Noble, MD as PCP - General (Internal Medicine)  CHIEF COMPLAINT:  Chief Complaint  Patient presents with  . Follow-up    small cell lung cancer  . Chemotherapy    INTERVAL HISTORY: Patient returns to clinic today for further evaluation and consideration of cycle 3 of cisplatin and etoposide. She feels increased weakness and fatigue. Her appetite has improved and she is no longer complaining of nausea.  She continues to have cough and shortness of breath. She has no neurologic complaints. She denies any recent fevers. She has no chest pain. She denies any constipation or diarrhea. She has no urinary complaints. Patient offers no further specific complaints.  REVIEW OF SYSTEMS:   Review of Systems  Constitutional: Positive for malaise/fatigue. Negative for weight loss.  Respiratory: Positive for cough.   Gastrointestinal: Positive for nausea.  Neurological: Positive for weakness.    As per HPI. Otherwise, a complete review of systems is negatve.  PAST MEDICAL HISTORY: Past Medical History  Diagnosis Date  . Lung cancer     PAST SURGICAL HISTORY: Past Surgical History  Procedure Laterality Date  . Cholecystectomy      FAMILY HISTORY No family history on file.     ADVANCED DIRECTIVES:    HEALTH MAINTENANCE: History  Substance Use Topics  . Smoking status: Current Some Day Smoker  . Smokeless tobacco: Never Used  . Alcohol Use: No     Colonoscopy:  PAP:  Bone density:  Lipid panel:  Allergies  Allergen Reactions  . No Known Allergies     Current Outpatient Prescriptions  Medication Sig Dispense Refill  . HYDROcodone-acetaminophen (NORCO/VICODIN) 5-325 MG per tablet Take 1 tablet by mouth every 6 (six) hours as needed for moderate pain.    Marland Kitchen prochlorperazine (COMPAZINE) 10 MG tablet  Take 10 mg by mouth every 6 (six) hours as needed for nausea or vomiting.    . benzonatate (TESSALON) 100 MG capsule Take by mouth 3 (three) times daily as needed for cough.     No current facility-administered medications for this visit.    OBJECTIVE: Filed Vitals:   11/03/14 1122  BP: 136/95  Pulse: 88  Temp: 96.5 F (35.8 C)  Resp: 20     Body mass index is 35.45 kg/(m^2).    ECOG FS:1 - Symptomatic but completely ambulatory  General: Well-developed, well-nourished, no acute distress. Eyes: anicteric sclera. Lungs: Clear to auscultation bilaterally. Heart: Regular rate and rhythm. No rubs, murmurs, or gallops. Abdomen: Soft, nontender, nondistended. No organomegaly noted, normoactive bowel sounds. Musculoskeletal: No edema, cyanosis, or clubbing. Neuro: Alert, answering all questions appropriately. Cranial nerves grossly intact. Skin: No rashes or petechiae noted. Psych: Normal affect.    LAB RESULTS:  Lab Results  Component Value Date   NA 134* 11/03/2014   K 3.4* 11/03/2014   CL 102 11/03/2014   CO2 26 11/03/2014   GLUCOSE 161* 11/03/2014   BUN 9 11/03/2014   CREATININE 0.67 11/03/2014   CALCIUM 8.3* 11/03/2014   PROT 7.0 11/03/2014   ALBUMIN 3.4* 11/03/2014   AST 19 11/03/2014   ALT 10* 11/03/2014   ALKPHOS 86 11/03/2014   BILITOT 0.3 11/03/2014   GFRNONAA >60 11/03/2014   GFRAA >60 11/03/2014    Lab Results  Component Value Date   WBC 10.6 11/03/2014   NEUTROABS 7.6* 11/03/2014   HGB 13.3 11/03/2014  HCT 39.9 11/03/2014   MCV 91.7 11/03/2014   PLT 314 11/03/2014     STUDIES: No results found.  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   PLAN:    1. Small cell lung carcinoma:  Confirmed from bronchoscopy. PET scan and CT scan images reviewed independently consistent with stage IV disease with adrenal metastasis. Patient refused MRI of the brain. Proceed with cycle 3 of 4 of cisplatin plus etoposide today. Return  to clinic in 1 and 2 days for etoposide only and then in 3 weeks for consideration of cycle 4.  Plan to reimage after cycle 4. Patient expressed understanding and was in agreement with this plan. 2. Cough: Continue current prescriptions as prescribed. 3. Weakness and fatigue: Secondary to chemotherapy, monitor. 4. Nausea: Continue current anti-emetic regimen.   Patient expressed understanding and was in agreement with this plan. She also understands that She can call clinic at any time with any questions, concerns, or complaints.   Small cell carcinoma of lung   Staging form: Lung, AJCC 7th Edition     Clinical stage from 10/17/2014: T4, N3, M1 - Signed by Lloyd Huger, MD on 10/17/2014   Lloyd Huger, MD   11/23/2014 8:52 AM

## 2014-11-24 ENCOUNTER — Inpatient Hospital Stay: Payer: Medicaid Other | Attending: Oncology

## 2014-11-24 ENCOUNTER — Inpatient Hospital Stay (HOSPITAL_BASED_OUTPATIENT_CLINIC_OR_DEPARTMENT_OTHER): Payer: Medicaid Other | Admitting: Oncology

## 2014-11-24 ENCOUNTER — Inpatient Hospital Stay: Payer: Medicaid Other

## 2014-11-24 VITALS — BP 104/72 | HR 72 | Resp 20

## 2014-11-24 VITALS — BP 125/84 | HR 82 | Temp 95.6°F | Resp 20 | Wt 194.4 lb

## 2014-11-24 DIAGNOSIS — C7970 Secondary malignant neoplasm of unspecified adrenal gland: Secondary | ICD-10-CM | POA: Insufficient documentation

## 2014-11-24 DIAGNOSIS — F17210 Nicotine dependence, cigarettes, uncomplicated: Secondary | ICD-10-CM

## 2014-11-24 DIAGNOSIS — C3492 Malignant neoplasm of unspecified part of left bronchus or lung: Secondary | ICD-10-CM | POA: Diagnosis not present

## 2014-11-24 DIAGNOSIS — R05 Cough: Secondary | ICD-10-CM | POA: Insufficient documentation

## 2014-11-24 DIAGNOSIS — R5381 Other malaise: Secondary | ICD-10-CM

## 2014-11-24 DIAGNOSIS — Z5111 Encounter for antineoplastic chemotherapy: Secondary | ICD-10-CM | POA: Insufficient documentation

## 2014-11-24 DIAGNOSIS — R531 Weakness: Secondary | ICD-10-CM | POA: Insufficient documentation

## 2014-11-24 DIAGNOSIS — R110 Nausea: Secondary | ICD-10-CM | POA: Insufficient documentation

## 2014-11-24 DIAGNOSIS — R0602 Shortness of breath: Secondary | ICD-10-CM | POA: Insufficient documentation

## 2014-11-24 DIAGNOSIS — Z79899 Other long term (current) drug therapy: Secondary | ICD-10-CM

## 2014-11-24 DIAGNOSIS — R5383 Other fatigue: Secondary | ICD-10-CM | POA: Diagnosis not present

## 2014-11-24 DIAGNOSIS — C3490 Malignant neoplasm of unspecified part of unspecified bronchus or lung: Secondary | ICD-10-CM

## 2014-11-24 LAB — CBC WITH DIFFERENTIAL/PLATELET
Basophils Absolute: 0 10*3/uL (ref 0–0.1)
Basophils Relative: 1 %
EOS ABS: 0.1 10*3/uL (ref 0–0.7)
Eosinophils Relative: 3 %
HCT: 37.2 % (ref 35.0–47.0)
HEMOGLOBIN: 12.3 g/dL (ref 12.0–16.0)
LYMPHS PCT: 50 %
Lymphs Abs: 1.7 10*3/uL (ref 1.0–3.6)
MCH: 30.9 pg (ref 26.0–34.0)
MCHC: 33.2 g/dL (ref 32.0–36.0)
MCV: 93 fL (ref 80.0–100.0)
Monocytes Absolute: 0.4 10*3/uL (ref 0.2–0.9)
Monocytes Relative: 14 %
NEUTROS ABS: 1 10*3/uL — AB (ref 1.4–6.5)
Neutrophils Relative %: 32 %
Platelets: 345 10*3/uL (ref 150–440)
RBC: 4 MIL/uL (ref 3.80–5.20)
RDW: 17.1 % — AB (ref 11.5–14.5)
WBC: 3.3 10*3/uL — AB (ref 3.6–11.0)

## 2014-11-24 LAB — COMPREHENSIVE METABOLIC PANEL
ALT: 11 U/L — ABNORMAL LOW (ref 14–54)
AST: 18 U/L (ref 15–41)
Albumin: 3.5 g/dL (ref 3.5–5.0)
Alkaline Phosphatase: 85 U/L (ref 38–126)
Anion gap: 5 (ref 5–15)
BUN: 10 mg/dL (ref 6–20)
CO2: 27 mmol/L (ref 22–32)
Calcium: 8.6 mg/dL — ABNORMAL LOW (ref 8.9–10.3)
Chloride: 104 mmol/L (ref 101–111)
Creatinine, Ser: 0.71 mg/dL (ref 0.44–1.00)
GFR calc Af Amer: >60 mL/min (ref >60)
GFR calc non Af Amer: >60 mL/min (ref >60)
Glucose, Bld: 129 mg/dL — ABNORMAL HIGH (ref 65–99)
POTASSIUM: 4 mmol/L (ref 3.5–5.1)
SODIUM: 136 mmol/L (ref 135–145)
TOTAL PROTEIN: 6.8 g/dL (ref 6.5–8.1)
Total Bilirubin: 0.3 mg/dL (ref 0.3–1.2)

## 2014-11-24 MED ORDER — POTASSIUM CHLORIDE 2 MEQ/ML IV SOLN
Freq: Once | INTRAVENOUS | Status: AC
  Administered 2014-11-24: 11:00:00 via INTRAVENOUS
  Filled 2014-11-24: qty 1000

## 2014-11-24 MED ORDER — CISPLATIN CHEMO INJECTION 100MG/100ML
80.0000 mg/m2 | Freq: Once | INTRAVENOUS | Status: AC
  Administered 2014-11-24: 155 mg via INTRAVENOUS
  Filled 2014-11-24: qty 155

## 2014-11-24 MED ORDER — SODIUM CHLORIDE 0.9 % IV SOLN
80.0000 mg/m2 | Freq: Once | INTRAVENOUS | Status: AC
  Administered 2014-11-24: 160 mg via INTRAVENOUS
  Filled 2014-11-24: qty 8

## 2014-11-24 MED ORDER — PALONOSETRON HCL INJECTION 0.25 MG/5ML
0.2500 mg | Freq: Once | INTRAVENOUS | Status: AC
  Administered 2014-11-24: 0.25 mg via INTRAVENOUS
  Filled 2014-11-24: qty 5

## 2014-11-24 MED ORDER — SODIUM CHLORIDE 0.9 % IV SOLN
Freq: Once | INTRAVENOUS | Status: AC
  Administered 2014-11-24: 11:00:00 via INTRAVENOUS
  Filled 2014-11-24: qty 1000

## 2014-11-24 MED ORDER — HEPARIN SOD (PORK) LOCK FLUSH 100 UNIT/ML IV SOLN
500.0000 [IU] | Freq: Once | INTRAVENOUS | Status: AC | PRN
  Administered 2014-11-24: 500 [IU]
  Filled 2014-11-24 (×2): qty 5

## 2014-11-24 MED ORDER — SODIUM CHLORIDE 0.9 % IV SOLN
Freq: Once | INTRAVENOUS | Status: AC
  Administered 2014-11-24: 14:00:00 via INTRAVENOUS
  Filled 2014-11-24: qty 5

## 2014-11-25 ENCOUNTER — Inpatient Hospital Stay: Payer: Medicaid Other

## 2014-11-25 VITALS — BP 106/72 | HR 85 | Temp 96.0°F | Resp 20

## 2014-11-25 DIAGNOSIS — Z5111 Encounter for antineoplastic chemotherapy: Secondary | ICD-10-CM | POA: Diagnosis not present

## 2014-11-25 DIAGNOSIS — C3492 Malignant neoplasm of unspecified part of left bronchus or lung: Secondary | ICD-10-CM

## 2014-11-25 MED ORDER — SODIUM CHLORIDE 0.9 % IV SOLN
80.0000 mg/m2 | Freq: Once | INTRAVENOUS | Status: AC
  Administered 2014-11-25: 160 mg via INTRAVENOUS
  Filled 2014-11-25: qty 8

## 2014-11-25 MED ORDER — SODIUM CHLORIDE 0.9 % IV SOLN
10.0000 mg | Freq: Once | INTRAVENOUS | Status: AC
  Administered 2014-11-25: 10 mg via INTRAVENOUS
  Filled 2014-11-25: qty 1

## 2014-11-25 MED ORDER — SODIUM CHLORIDE 0.9 % IV SOLN
Freq: Once | INTRAVENOUS | Status: AC
  Administered 2014-11-25: 15:00:00 via INTRAVENOUS
  Filled 2014-11-25: qty 1000

## 2014-11-25 MED ORDER — HEPARIN SOD (PORK) LOCK FLUSH 100 UNIT/ML IV SOLN
500.0000 [IU] | Freq: Once | INTRAVENOUS | Status: AC | PRN
  Administered 2014-11-25: 500 [IU]
  Filled 2014-11-25: qty 5

## 2014-11-26 ENCOUNTER — Inpatient Hospital Stay: Payer: Medicaid Other

## 2014-11-26 VITALS — BP 108/71 | HR 77 | Temp 97.0°F | Resp 20

## 2014-11-26 DIAGNOSIS — C3492 Malignant neoplasm of unspecified part of left bronchus or lung: Secondary | ICD-10-CM

## 2014-11-26 DIAGNOSIS — Z5111 Encounter for antineoplastic chemotherapy: Secondary | ICD-10-CM | POA: Diagnosis not present

## 2014-11-26 MED ORDER — SODIUM CHLORIDE 0.9 % IV SOLN
Freq: Once | INTRAVENOUS | Status: AC
  Administered 2014-11-26: 14:00:00 via INTRAVENOUS
  Filled 2014-11-26: qty 1000

## 2014-11-26 MED ORDER — HEPARIN SOD (PORK) LOCK FLUSH 100 UNIT/ML IV SOLN: 500.0000 [IU] | Freq: Once | INTRAVENOUS | Status: DC | PRN

## 2014-11-26 MED ORDER — SODIUM CHLORIDE 0.9 % IV SOLN
80.0000 mg/m2 | Freq: Once | INTRAVENOUS | Status: AC
  Administered 2014-11-26: 160 mg via INTRAVENOUS
  Filled 2014-11-26: qty 8

## 2014-11-26 MED ORDER — SODIUM CHLORIDE 0.9 % IV SOLN
10.0000 mg | Freq: Once | INTRAVENOUS | Status: AC
  Administered 2014-11-26: 10 mg via INTRAVENOUS
  Filled 2014-11-26: qty 1

## 2014-12-03 ENCOUNTER — Encounter: Payer: Self-pay | Admitting: Radiation Oncology

## 2014-12-03 ENCOUNTER — Ambulatory Visit
Admission: RE | Admit: 2014-12-03 | Discharge: 2014-12-03 | Disposition: A | Discharge status: Home / Self Care | Payer: Medicaid Other | Source: Ambulatory Visit | Attending: Radiation Oncology | Admitting: Radiation Oncology

## 2014-12-03 VITALS — BP 135/90 | HR 77 | Temp 96.0°F | Resp 20 | Wt 188.1 lb

## 2014-12-03 DIAGNOSIS — C3492 Malignant neoplasm of unspecified part of left bronchus or lung: Secondary | ICD-10-CM

## 2014-12-03 NOTE — Progress Notes (Signed)
Radiation Oncology Follow up Note  Name: Tammy Andersen   Date:   12/03/2014 MRN:  [id number] DOB: 25-Apr-1971    This 44 y.o. female presents to the clinic today for stage IV (T3 N3 M1) small cell lung cancer M1 by adrenal involvement who has completed initial chemotherapy.Marland Kitchen  REFERRING PROVIDER: No ref. provider found  HPI: Patient is a 44 year old female originally consult back in March 2016 when she presented with extensive stage small cell lung cancer with involvement of the adrenal gland. She was found to have extensive left hilar mediastinal bulky adenopathy with encasement and narrowing of the left pulmonary artery. She has undergone. Cis-platinum and etoposide chemotherapy which is tolerated only fair with significant weakness and fatigue. She states she has completed her chemotherapy and I will review that with Dr. Grayland Ormond. She specifically denies hemoptysis. She states she is breathing fine at this time. I originally propose possible palliative radiation therapy to residual disease in her chest after initial chemotherapy.  COMPLICATIONS OF TREATMENT: As above  FOLLOW UP COMPLIANCE: keeps appointments   PHYSICAL EXAM:  BP 135/90 mmHg  Pulse 77  Temp(Src) 96 F (35.6 C)  Resp 20  Wt 188 lb 0.8 oz (85.3 kg) Well-developed female in NAD with poor oral hygiene and dentition. Well-developed well-nourished patient in NAD. HEENT reveals PERLA, EOMI, discs not visualized.  Oral cavity is clear. No oral mucosal lesions are identified. Neck is clear without evidence of cervical or supraclavicular adenopathy. Lungs are clear to A&P. Cardiac examination is essentially unremarkable with regular rate and rhythm without murmur rub or thrill. Abdomen is benign with no organomegaly or masses noted. Motor sensory and DTR levels are equal and symmetric in the upper and lower extremities. Cranial nerves II through XII are grossly intact. Proprioception is intact. No peripheral adenopathy or edema is  identified. No motor or sensory levels are noted. Crude visual fields are within normal range.   RADIOLOGY RESULTS: Patient scheduled for follow-up PET CT scan the end of this month  PLAN: At this time like to evaluate her follow-up restaging PET/CT scan. Should the base residual significant disease in her left hilum and lung which could further cause atelectasis or hemoptysis I would be interested in delivering 3000 cGy in 10 fractions as a palliative course of radiation. This is been shown to increase overall survival as well as an excellent addition for palliation of further problems as described above. I've set up a follow-up shortly after her PET CT scan. Will discuss the case personally with medical oncology. Patient is comfortable with my treatment planning.  I would like to take this opportunity for allowing me to participate in the care of your patient.Armstead Peaks., MD

## 2014-12-06 NOTE — Progress Notes (Signed)
[facility]  Telephone:(336) [phone] Fax:(336) [phone]  ID: Tammy Andersen OB: Jul 07, 1970  MR#: [id number]  IOE#:[id number]  Patient Care Team: Marden Noble, MD as PCP - General (Internal Medicine)  CHIEF COMPLAINT:  Chief Complaint  Patient presents with  . Follow-up    lung cancer  . Chemotherapy    INTERVAL HISTORY: Patient returns to clinic today for further evaluation and consideration of cycle 4 of cisplatin and etoposide. Her only complaint is continued discomfort at the site of her port. Her appetite has improved and she is no longer complaining of nausea.  She continues to have cough and shortness of breath. She has no neurologic complaints. She denies any recent fevers. She has no chest pain. She denies any constipation or diarrhea. She has no urinary complaints. Patient offers no further specific complaints.  REVIEW OF SYSTEMS:   Review of Systems  Constitutional: Positive for malaise/fatigue. Negative for weight loss.  Respiratory: Positive for cough.   Gastrointestinal: Positive for nausea.  Neurological: Positive for weakness.    As per HPI. Otherwise, a complete review of systems is negatve.  PAST MEDICAL HISTORY: Past Medical History  Diagnosis Date  . Lung cancer     PAST SURGICAL HISTORY: Past Surgical History  Procedure Laterality Date  . Cholecystectomy      FAMILY HISTORY No family history on file.     ADVANCED DIRECTIVES:    HEALTH MAINTENANCE: History  Substance Use Topics  . Smoking status: Current Some Day Smoker  . Smokeless tobacco: Never Used  . Alcohol Use: No     Colonoscopy:  PAP:  Bone density:  Lipid panel:  Allergies  Allergen Reactions  . No Known Allergies     Current Outpatient Prescriptions  Medication Sig Dispense Refill  . benzonatate (TESSALON) 100 MG capsule Take by mouth 3 (three) times daily as needed for cough.    Marland Kitchen HYDROcodone-acetaminophen (NORCO/VICODIN) 5-325 MG per tablet Take  1 tablet by mouth every 6 (six) hours as needed for moderate pain.    Marland Kitchen prochlorperazine (COMPAZINE) 10 MG tablet Take 10 mg by mouth every 6 (six) hours as needed for nausea or vomiting.     No current facility-administered medications for this visit.    OBJECTIVE: Filed Vitals:   11/24/14 1101  BP: 125/84  Pulse: 82  Temp: 95.6 F (35.3 C)  Resp: 20     Body mass index is 35.69 kg/(m^2).    ECOG FS:1 - Symptomatic but completely ambulatory  General: Well-developed, well-nourished, no acute distress. Eyes: anicteric sclera. Chest wall: Port site without erythema. Mild tenderness to palpation. Lungs: Clear to auscultation bilaterally. Heart: Regular rate and rhythm. No rubs, murmurs, or gallops. Abdomen: Soft, nontender, nondistended. No organomegaly noted, normoactive bowel sounds. Musculoskeletal: No edema, cyanosis, or clubbing. Neuro: Alert, answering all questions appropriately. Cranial nerves grossly intact. Skin: No rashes or petechiae noted. Psych: Normal affect.    LAB RESULTS:  Lab Results  Component Value Date   NA 136 11/24/2014   K 4.0 11/24/2014   CL 104 11/24/2014   CO2 27 11/24/2014   GLUCOSE 129* 11/24/2014   BUN 10 11/24/2014   CREATININE 0.71 11/24/2014   CALCIUM 8.6* 11/24/2014   PROT 6.8 11/24/2014   ALBUMIN 3.5 11/24/2014   AST 18 11/24/2014   ALT 11* 11/24/2014   ALKPHOS 85 11/24/2014   BILITOT 0.3 11/24/2014   GFRNONAA >60 11/24/2014   GFRAA >60 11/24/2014    Lab Results  Component Value Date  WBC 3.3* 11/24/2014   NEUTROABS 1.0* 11/24/2014   HGB 12.3 11/24/2014   HCT 37.2 11/24/2014   MCV 93.0 11/24/2014   PLT 345 11/24/2014     STUDIES: No results found.  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   PLAN:    1. Small cell lung carcinoma:  Confirmed from bronchoscopy. PET scan and CT scan images reviewed independently consistent with stage IV disease with adrenal metastasis. Patient refused  MRI of the brain. Proceed with cycle 4 of cisplatin plus etoposide today. Return to clinic in 1 and 2 days for etoposide only and then in 3 weeks for consideration of cycle 5.  Plan to reimage prior to her next chemotherapy. Patient expressed understanding and was in agreement with this plan. 2. Cough: Continue current prescriptions as prescribed. 3. Weakness and fatigue: Secondary to chemotherapy, monitor. 4. Nausea: Continue current anti-emetic regimen.  Patient expressed understanding and was in agreement with this plan. She also understands that She can call clinic at any time with any questions, concerns, or complaints.   Small cell carcinoma of lung   Staging form: Lung, AJCC 7th Edition     Clinical stage from 10/17/2014: T4, N3, M1 - Signed by Lloyd Huger, MD on 10/17/2014   Lloyd Huger, MD   12/06/2014 5:52 PM

## 2015-01-12 ENCOUNTER — Other Ambulatory Visit: Payer: Self-pay | Admitting: Oncology

## 2015-01-13 ENCOUNTER — Ambulatory Visit: Payer: Medicaid Other | Admitting: Radiation Oncology

## 2015-01-25 ENCOUNTER — Ambulatory Visit: Payer: Medicaid Other

## 2015-01-26 ENCOUNTER — Other Ambulatory Visit: Payer: Self-pay | Admitting: *Deleted

## 2015-01-26 DIAGNOSIS — C3492 Malignant neoplasm of unspecified part of left bronchus or lung: Secondary | ICD-10-CM

## 2015-01-27 ENCOUNTER — Ambulatory Visit: Payer: Medicaid Other | Admitting: Radiation Oncology

## 2015-01-27 ENCOUNTER — Inpatient Hospital Stay: Payer: Medicaid Other | Admitting: Oncology

## 2015-02-02 ENCOUNTER — Other Ambulatory Visit: Payer: Self-pay | Admitting: *Deleted

## 2015-02-02 DIAGNOSIS — C3492 Malignant neoplasm of unspecified part of left bronchus or lung: Secondary | ICD-10-CM

## 2015-02-03 ENCOUNTER — Ambulatory Visit
Admission: RE | Admit: 2015-02-03 | Discharge: 2015-02-03 | Disposition: A | Discharge status: Home / Self Care | Payer: Medicaid Other | Source: Ambulatory Visit | Attending: Oncology | Admitting: Oncology

## 2015-02-03 DIAGNOSIS — C3492 Malignant neoplasm of unspecified part of left bronchus or lung: Secondary | ICD-10-CM

## 2015-02-03 DIAGNOSIS — C3490 Malignant neoplasm of unspecified part of unspecified bronchus or lung: Secondary | ICD-10-CM | POA: Diagnosis not present

## 2015-02-03 MED ORDER — IOHEXOL 350 MG/ML SOLN
100.0000 mL | Freq: Once | INTRAVENOUS | Status: AC | PRN
  Administered 2015-02-03: 100 mL via INTRAVENOUS

## 2015-02-10 ENCOUNTER — Ambulatory Visit
Admission: RE | Admit: 2015-02-10 | Discharge: 2015-02-10 | Disposition: A | Discharge status: Home / Self Care | Payer: Medicaid Other | Source: Ambulatory Visit | Attending: Radiation Oncology | Admitting: Radiation Oncology

## 2015-02-10 ENCOUNTER — Other Ambulatory Visit: Payer: Self-pay | Admitting: *Deleted

## 2015-02-10 ENCOUNTER — Encounter: Payer: Self-pay | Admitting: Radiation Oncology

## 2015-02-10 ENCOUNTER — Inpatient Hospital Stay: Payer: Medicaid Other | Attending: Oncology | Admitting: Oncology

## 2015-02-10 VITALS — BP 142/95 | HR 81 | Temp 96.5°F | Resp 18 | Wt 195.4 lb

## 2015-02-10 DIAGNOSIS — C3490 Malignant neoplasm of unspecified part of unspecified bronchus or lung: Secondary | ICD-10-CM | POA: Diagnosis not present

## 2015-02-10 DIAGNOSIS — R05 Cough: Secondary | ICD-10-CM | POA: Insufficient documentation

## 2015-02-10 DIAGNOSIS — F17210 Nicotine dependence, cigarettes, uncomplicated: Secondary | ICD-10-CM | POA: Insufficient documentation

## 2015-02-10 DIAGNOSIS — D701 Agranulocytosis secondary to cancer chemotherapy: Secondary | ICD-10-CM | POA: Insufficient documentation

## 2015-02-10 DIAGNOSIS — R0602 Shortness of breath: Secondary | ICD-10-CM | POA: Insufficient documentation

## 2015-02-10 DIAGNOSIS — T451X5S Adverse effect of antineoplastic and immunosuppressive drugs, sequela: Secondary | ICD-10-CM | POA: Insufficient documentation

## 2015-02-10 DIAGNOSIS — C3492 Malignant neoplasm of unspecified part of left bronchus or lung: Secondary | ICD-10-CM

## 2015-02-10 DIAGNOSIS — Z79899 Other long term (current) drug therapy: Secondary | ICD-10-CM | POA: Diagnosis not present

## 2015-02-10 NOTE — Progress Notes (Signed)
Radiation Oncology Follow up Note  Name: Tammy Andersen   Date:   02/10/2015 MRN:  [id number] DOB: 1971-02-13    This 44 y.o. female presents to the clinic today for follow-up for stage IV small cell lung cancer.  REFERRING PROVIDER: Marden Noble, MD  HPI: Patient is a 44 year old female with stage IV (T3 N3 M1) small cell lung cancer M1 by adrenal involvement seen on her PET/CT scan. We had a repeat CT scan performed after chemotherapy to evaluate for possible palliative treatment to her chest.. CT scan showed excellent response to therapy with marked improvement in mediastinal or hilar adenopathy. There is also significant improved aeration of left upper lobe. She also had a resolved left adrenal nodule. At this time patient is feeling good specifically denies cough hemoptysis or chest tightness.  COMPLICATIONS OF TREATMENT: none  FOLLOW UP COMPLIANCE: keeps appointments   PHYSICAL EXAM:  BP 142/95 mmHg  Pulse 81  Temp(Src) 96.5 F (35.8 C)  Resp 18  Wt 195 lb 7 oz (88.65 kg) Well-developed well-nourished patient in NAD. HEENT reveals PERLA, EOMI, discs not visualized.  Oral cavity is clear. No oral mucosal lesions are identified. Neck is clear without evidence of cervical or supraclavicular adenopathy. Lungs are clear to A&P. Cardiac examination is essentially unremarkable with regular rate and rhythm without murmur rub or thrill. Abdomen is benign with no organomegaly or masses noted. Motor sensory and DTR levels are equal and symmetric in the upper and lower extremities. Cranial nerves II through XII are grossly intact. Proprioception is intact. No peripheral adenopathy or edema is identified. No motor or sensory levels are noted. Crude visual fields are within normal range.   RADIOLOGY RESULTS: Serial CT scans are reviewed  PLAN: At this time I see no role for palliative radiation therapy since her hilar disease has completely responded. I'm please were overall progress. Would  continue follow-up care with medical oncology. I would be happy to reevaluate this patient at any time should palliative treatment be indicated. Patient is comfortable with my decision and plan.  I would like to take this opportunity for allowing me to participate in the care of your patient.Armstead Peaks., MD

## 2015-02-16 NOTE — Progress Notes (Signed)
[place]  [phone] [phone]  ID: Tammy Andersen OB: [birth year]  MR#: [id number]  WIO#:[id number]  Patient Care Team: Marden Noble, MD as PCP - General (Internal Medicine)  CHIEF COMPLAINT:  No chief complaint on file.   INTERVAL HISTORY: Patient returns to clinic today for further evaluation and discussion of her imaging results. She currently feels well and is asymptomatic. She has chronic shortness of breath and cough which is unchanged. Her appetite has improved and she is no longer complaining of nausea. She has no neurologic complaints. She denies any recent fevers. She has no chest pain. She denies any constipation or diarrhea. She has no urinary complaints. Patient offers no further specific complaints.  REVIEW OF SYSTEMS:   Review of Systems  Constitutional: Negative for weight loss and malaise/fatigue.  Respiratory: Positive for cough and shortness of breath.   Gastrointestinal: Negative for nausea.  Neurological: Negative for weakness.    As per HPI. Otherwise, a complete review of systems is negatve.  PAST MEDICAL HISTORY: Past Medical History  Diagnosis Date  . Lung cancer     PAST SURGICAL HISTORY: Past Surgical History  Procedure Laterality Date  . Cholecystectomy      FAMILY HISTORY No family history on file.     ADVANCED DIRECTIVES:    HEALTH MAINTENANCE: Social History  Substance Use Topics  . Smoking status: Current Some Day Smoker  . Smokeless tobacco: Never Used  . Alcohol Use: No     Colonoscopy:  PAP:  Bone density:  Lipid panel:  Allergies  Allergen Reactions  . No Known Allergies     Current Outpatient Prescriptions  Medication Sig Dispense Refill  . benzonatate (TESSALON) 100 MG capsule Take by mouth 3 (three) times daily as needed for cough.    Marland Kitchen HYDROcodone-acetaminophen (NORCO/VICODIN) 5-325 MG per tablet Take 1 tablet by mouth every 6 (six) hours as needed for moderate pain.     Marland Kitchen prochlorperazine (COMPAZINE) 10 MG tablet Take 10 mg by mouth every 6 (six) hours as needed for nausea or vomiting.     No current facility-administered medications for this visit.    OBJECTIVE: There were no vitals filed for this visit.   There is no weight on file to calculate BMI.    ECOG FS:1 - Symptomatic but completely ambulatory  General: Well-developed, well-nourished, no acute distress. Eyes: anicteric sclera. Chest wall: Port site without erythema. Mild tenderness to palpation. Lungs: Clear to auscultation bilaterally. Heart: Regular rate and rhythm. No rubs, murmurs, or gallops. Abdomen: Soft, nontender, nondistended. No organomegaly noted, normoactive bowel sounds. Musculoskeletal: No edema, cyanosis, or clubbing. Neuro: Alert, answering all questions appropriately. Cranial nerves grossly intact. Skin: No rashes or petechiae noted. Psych: Normal affect.    LAB RESULTS:  Lab Results  Component Value Date   NA 136 11/24/2014   K 4.0 11/24/2014   CL 104 11/24/2014   CO2 27 11/24/2014   GLUCOSE 129* 11/24/2014   BUN 10 11/24/2014   CREATININE 0.71 11/24/2014   CALCIUM 8.6* 11/24/2014   PROT 6.8 11/24/2014   ALBUMIN 3.5 11/24/2014   AST 18 11/24/2014   ALT 11* 11/24/2014   ALKPHOS 85 11/24/2014   BILITOT 0.3 11/24/2014   GFRNONAA >60 11/24/2014   GFRAA >60 11/24/2014    Lab Results  Component Value Date   WBC 3.3* 11/24/2014   NEUTROABS 1.0* 11/24/2014   HGB 12.3 11/24/2014   HCT 37.2 11/24/2014   MCV 93.0 11/24/2014   PLT 345  11/24/2014     STUDIES: Ct Chest W Contrast  02/03/2015   CLINICAL DATA:  Restaging small cell lung cancer post chemotherapy completed several weeks ago. Subsequent encounter.  EXAM: CT CHEST AND ABDOMEN WITH CONTRAST  TECHNIQUE: Multidetector CT imaging of the chest and abdomen pelvis was performed following the standard protocol during bolus administration of intravenous contrast.  CONTRAST:  165m OMNIPAQUE IOHEXOL 350  MG/ML SOLN  COMPARISON:  Chest CT 08/07/2014.  PET-CT 08/18/2014.  FINDINGS: CT CHEST FINDINGS  Mediastinum/Nodes: Interval significant improvement in previously demonstrated extensive mediastinal and left hilar lymphadenopathy. Residual subcarinal node measures 11 mm short axis on image 23. There is a 1.8 cm left hilar node on image 23 and a 9 mm AP window node on image 17. 8 mm right paratracheal node on image 17 was not hypermetabolic on PET-CT. There is no axillary adenopathy. The thyroid gland, trachea and esophagus demonstrate no significant findings. The heart size is normal. There is no pericardial effusion. Right IJ Port-A-Cath tip at the SVC right atrial junction. Stable Coronary artery calcifications.  Lungs/Pleura: There is no pleural effusion.Interval partial clearing of left upper lobe infiltrate with residual predominately linear opacity most consistent with atelectasis. There is no well-defined nodule in this area. Previously noted narrowing of the left-sided bronchi has improved. There is persistent mosaic attenuation of the lungs with patchy ground-glass opacities.  Musculoskeletal/Chest wall: No chest wall mass or suspicious osseous findings.  CT ABDOMEN FINDINGS  Hepatobiliary: The liver is normal in density without focal abnormality. No biliary dilatation status post cholecystectomy.  Pancreas: Unremarkable. No pancreatic ductal dilatation or surrounding inflammatory changes.  Spleen: Normal in size without focal abnormality.  Adrenals/Urinary Tract: There is no residual wall thickening or nodularity of the left adrenal gland. The right adrenal gland appears normal. The kidneys appear normal without hydronephrosis.  Stomach/Bowel: No evidence of bowel wall thickening, distention or surrounding inflammatory change.  Vascular/Lymphatic: There are stable prominent lymph nodes within the gastrohepatic ligament and upper retroperitoneum, including a 1.4 cm node on image number 46. These nodes were  not hypermetabolic on prior PET-CT. Stable aortoiliac atherosclerosis.  Other: Stable 5 cm right anterior abdominal wall hernia containing only fat.  Musculoskeletal: No acute or significant osseous findings. Left L5 pars defect noted.  IMPRESSION: 1. Significant interval improvement in previously demonstrated mediastinal and hilar adenopathy consistent with response to therapy. There are small residual mediastinal and left hilar lymph nodes. 2. Significant interval improved aeration of the left upper lobe with minimal residual linear atelectasis. No peripheral lung lesion demonstrated. 3. Resolved left adrenal nodule. 4. No evidence progressive disease or acute findings. Prominent lymph nodes in the upper abdomen are stable, not hypermetabolic on PET-CT.   Electronically Signed   By: WRichardean SaleM.D.   On: 02/03/2015 11:41   Ct Abdomen W Contrast  02/03/2015   CLINICAL DATA:  Restaging small cell lung cancer post chemotherapy completed several weeks ago. Subsequent encounter.  EXAM: CT CHEST AND ABDOMEN WITH CONTRAST  TECHNIQUE: Multidetector CT imaging of the chest and abdomen pelvis was performed following the standard protocol during bolus administration of intravenous contrast.  CONTRAST:  1032mOMNIPAQUE IOHEXOL 350 MG/ML SOLN  COMPARISON:  Chest CT 08/07/2014.  PET-CT 08/18/2014.  FINDINGS: CT CHEST FINDINGS  Mediastinum/Nodes: Interval significant improvement in previously demonstrated extensive mediastinal and left hilar lymphadenopathy. Residual subcarinal node measures 11 mm short axis on image 23. There is a 1.8 cm left hilar node on image 23 and a 9 mm AP  window node on image 17. 8 mm right paratracheal node on image 17 was not hypermetabolic on PET-CT. There is no axillary adenopathy. The thyroid gland, trachea and esophagus demonstrate no significant findings. The heart size is normal. There is no pericardial effusion. Right IJ Port-A-Cath tip at the SVC right atrial junction. Stable Coronary  artery calcifications.  Lungs/Pleura: There is no pleural effusion.Interval partial clearing of left upper lobe infiltrate with residual predominately linear opacity most consistent with atelectasis. There is no well-defined nodule in this area. Previously noted narrowing of the left-sided bronchi has improved. There is persistent mosaic attenuation of the lungs with patchy ground-glass opacities.  Musculoskeletal/Chest wall: No chest wall mass or suspicious osseous findings.  CT ABDOMEN FINDINGS  Hepatobiliary: The liver is normal in density without focal abnormality. No biliary dilatation status post cholecystectomy.  Pancreas: Unremarkable. No pancreatic ductal dilatation or surrounding inflammatory changes.  Spleen: Normal in size without focal abnormality.  Adrenals/Urinary Tract: There is no residual wall thickening or nodularity of the left adrenal gland. The right adrenal gland appears normal. The kidneys appear normal without hydronephrosis.  Stomach/Bowel: No evidence of bowel wall thickening, distention or surrounding inflammatory change.  Vascular/Lymphatic: There are stable prominent lymph nodes within the gastrohepatic ligament and upper retroperitoneum, including a 1.4 cm node on image number 46. These nodes were not hypermetabolic on prior PET-CT. Stable aortoiliac atherosclerosis.  Other: Stable 5 cm right anterior abdominal wall hernia containing only fat.  Musculoskeletal: No acute or significant osseous findings. Left L5 pars defect noted.  IMPRESSION: 1. Significant interval improvement in previously demonstrated mediastinal and hilar adenopathy consistent with response to therapy. There are small residual mediastinal and left hilar lymph nodes. 2. Significant interval improved aeration of the left upper lobe with minimal residual linear atelectasis. No peripheral lung lesion demonstrated. 3. Resolved left adrenal nodule. 4. No evidence progressive disease or acute findings. Prominent lymph  nodes in the upper abdomen are stable, not hypermetabolic on PET-CT.   Electronically Signed   By: Richardean Sale M.D.   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   PLAN:    1. Small cell lung carcinoma:  CT scan results reviewed independently and reported as above with significant improvement of disease burden. There is no evidence of her adrenal metastasis. There are small residual mediastinal and left hilar nodes of unclear clinical significance. No intervention is needed at this time. Previously, patient refused MRI of the brain. Return to clinic in 3 months with repeat imaging and further evaluation. 2. Cough: Continue current prescriptions as prescribed. 3. Weakness and fatigue: Resolved. 4. Nausea: Resolved. 5. Leukopenia: Secondary to chemotherapy. Monitor.  Patient expressed understanding and was in agreement with this plan. She also understands that She can call clinic at any time with any questions, concerns, or complaints.   Small cell carcinoma of lung   Staging form: Lung, AJCC 7th Edition     Clinical stage from 10/17/2014: T4, N3, M1 - Signed by Lloyd Huger, MD on 10/17/2014   Lloyd Huger, MD   02/16/2015 9:03 AM

## 2015-02-25 ENCOUNTER — Inpatient Hospital Stay: Payer: Medicaid Other | Attending: Oncology

## 2015-02-25 DIAGNOSIS — Z9221 Personal history of antineoplastic chemotherapy: Secondary | ICD-10-CM | POA: Diagnosis not present

## 2015-02-25 DIAGNOSIS — Z452 Encounter for adjustment and management of vascular access device: Secondary | ICD-10-CM | POA: Insufficient documentation

## 2015-02-25 DIAGNOSIS — F17210 Nicotine dependence, cigarettes, uncomplicated: Secondary | ICD-10-CM | POA: Diagnosis not present

## 2015-02-25 DIAGNOSIS — C3490 Malignant neoplasm of unspecified part of unspecified bronchus or lung: Secondary | ICD-10-CM | POA: Insufficient documentation

## 2015-02-25 DIAGNOSIS — C801 Malignant (primary) neoplasm, unspecified: Secondary | ICD-10-CM

## 2015-02-25 MED ORDER — HEPARIN SOD (PORK) LOCK FLUSH 100 UNIT/ML IV SOLN
500.0000 [IU] | Freq: Once | INTRAVENOUS | Status: AC
  Administered 2015-02-25: 500 [IU] via INTRAVENOUS

## 2015-02-25 MED ORDER — HEPARIN SOD (PORK) LOCK FLUSH 100 UNIT/ML IV SOLN
INTRAVENOUS | Status: AC
  Filled 2015-02-25: qty 5

## 2015-02-25 MED ORDER — SODIUM CHLORIDE 0.9 % IJ SOLN
10.0000 mL | Freq: Once | INTRAMUSCULAR | Status: AC
  Administered 2015-02-25: 10 mL via INTRAVENOUS
  Filled 2015-02-25: qty 10

## 2015-04-08 ENCOUNTER — Inpatient Hospital Stay: Payer: Medicaid Other | Attending: Oncology

## 2015-04-22 ENCOUNTER — Telehealth: Payer: Self-pay

## 2015-04-22 NOTE — Telephone Encounter (Signed)
Patient called and left vm message regarding a need for assistance with gas. Attempted to contact by phone, no answer. Gas Voucher ready for pickup at front desk.

## 2015-05-10 ENCOUNTER — Ambulatory Visit: Admission: RE | Admit: 2015-05-10 | Payer: Medicaid Other | Source: Ambulatory Visit

## 2015-05-11 ENCOUNTER — Ambulatory Visit
Admission: RE | Admit: 2015-05-11 | Discharge: 2015-05-11 | Disposition: A | Discharge status: Home / Self Care | Payer: Medicaid Other | Source: Ambulatory Visit | Attending: Oncology | Admitting: Oncology

## 2015-05-11 DIAGNOSIS — I2510 Atherosclerotic heart disease of native coronary artery without angina pectoris: Secondary | ICD-10-CM | POA: Insufficient documentation

## 2015-05-11 DIAGNOSIS — R918 Other nonspecific abnormal finding of lung field: Secondary | ICD-10-CM | POA: Insufficient documentation

## 2015-05-11 DIAGNOSIS — C3490 Malignant neoplasm of unspecified part of unspecified bronchus or lung: Secondary | ICD-10-CM

## 2015-05-11 DIAGNOSIS — R590 Localized enlarged lymph nodes: Secondary | ICD-10-CM | POA: Insufficient documentation

## 2015-05-11 DIAGNOSIS — R935 Abnormal findings on diagnostic imaging of other abdominal regions, including retroperitoneum: Secondary | ICD-10-CM | POA: Insufficient documentation

## 2015-05-11 DIAGNOSIS — K439 Ventral hernia without obstruction or gangrene: Secondary | ICD-10-CM | POA: Insufficient documentation

## 2015-05-11 DIAGNOSIS — C7970 Secondary malignant neoplasm of unspecified adrenal gland: Secondary | ICD-10-CM | POA: Diagnosis not present

## 2015-05-11 DIAGNOSIS — I700 Atherosclerosis of aorta: Secondary | ICD-10-CM | POA: Insufficient documentation

## 2015-05-11 DIAGNOSIS — M5126 Other intervertebral disc displacement, lumbar region: Secondary | ICD-10-CM | POA: Insufficient documentation

## 2015-05-11 MED ORDER — IOHEXOL 300 MG/ML  SOLN
100.0000 mL | Freq: Once | INTRAMUSCULAR | Status: AC | PRN
  Administered 2015-05-11: 100 mL via INTRAVENOUS

## 2015-05-12 ENCOUNTER — Other Ambulatory Visit: Payer: Self-pay | Admitting: Oncology

## 2015-05-12 ENCOUNTER — Ambulatory Visit: Payer: Medicaid Other

## 2015-05-12 ENCOUNTER — Inpatient Hospital Stay: Payer: Medicaid Other

## 2015-05-12 ENCOUNTER — Inpatient Hospital Stay: Payer: Medicaid Other | Attending: Oncology | Admitting: Oncology

## 2015-05-12 ENCOUNTER — Other Ambulatory Visit: Payer: Medicaid Other

## 2015-05-12 VITALS — BP 137/87 | HR 87 | Temp 97.6°F | Resp 18 | Wt 214.1 lb

## 2015-05-12 DIAGNOSIS — Z9221 Personal history of antineoplastic chemotherapy: Secondary | ICD-10-CM | POA: Insufficient documentation

## 2015-05-12 DIAGNOSIS — C3490 Malignant neoplasm of unspecified part of unspecified bronchus or lung: Secondary | ICD-10-CM | POA: Insufficient documentation

## 2015-05-12 DIAGNOSIS — F17210 Nicotine dependence, cigarettes, uncomplicated: Secondary | ICD-10-CM | POA: Diagnosis not present

## 2015-05-12 DIAGNOSIS — C3492 Malignant neoplasm of unspecified part of left bronchus or lung: Secondary | ICD-10-CM

## 2015-05-12 DIAGNOSIS — R1900 Intra-abdominal and pelvic swelling, mass and lump, unspecified site: Secondary | ICD-10-CM | POA: Diagnosis not present

## 2015-05-12 DIAGNOSIS — R599 Enlarged lymph nodes, unspecified: Secondary | ICD-10-CM | POA: Insufficient documentation

## 2015-05-12 DIAGNOSIS — Z79899 Other long term (current) drug therapy: Secondary | ICD-10-CM | POA: Diagnosis not present

## 2015-05-12 DIAGNOSIS — M4806 Spinal stenosis, lumbar region: Secondary | ICD-10-CM | POA: Insufficient documentation

## 2015-05-12 DIAGNOSIS — I2510 Atherosclerotic heart disease of native coronary artery without angina pectoris: Secondary | ICD-10-CM

## 2015-05-12 DIAGNOSIS — Z923 Personal history of irradiation: Secondary | ICD-10-CM | POA: Diagnosis not present

## 2015-05-12 DIAGNOSIS — R05 Cough: Secondary | ICD-10-CM | POA: Insufficient documentation

## 2015-05-12 DIAGNOSIS — Z452 Encounter for adjustment and management of vascular access device: Secondary | ICD-10-CM | POA: Diagnosis not present

## 2015-05-12 DIAGNOSIS — M5126 Other intervertebral disc displacement, lumbar region: Secondary | ICD-10-CM | POA: Insufficient documentation

## 2015-05-12 DIAGNOSIS — K439 Ventral hernia without obstruction or gangrene: Secondary | ICD-10-CM

## 2015-05-12 DIAGNOSIS — C7970 Secondary malignant neoplasm of unspecified adrenal gland: Secondary | ICD-10-CM | POA: Diagnosis not present

## 2015-05-12 DIAGNOSIS — I2782 Chronic pulmonary embolism: Secondary | ICD-10-CM | POA: Diagnosis not present

## 2015-05-12 DIAGNOSIS — R0602 Shortness of breath: Secondary | ICD-10-CM | POA: Diagnosis not present

## 2015-05-12 LAB — CBC WITH DIFFERENTIAL/PLATELET
Basophils Absolute: 0.1 10*3/uL (ref 0–0.1)
Basophils Relative: 1 %
EOS PCT: 3 %
Eosinophils Absolute: 0.2 10*3/uL (ref 0–0.7)
HCT: 41.1 % (ref 35.0–47.0)
Hemoglobin: 13.8 g/dL (ref 12.0–16.0)
LYMPHS ABS: 2.2 10*3/uL (ref 1.0–3.6)
LYMPHS PCT: 26 %
MCH: 30.3 pg (ref 26.0–34.0)
MCHC: 33.4 g/dL (ref 32.0–36.0)
MCV: 90.5 fL (ref 80.0–100.0)
MONO ABS: 0.5 10*3/uL (ref 0.2–0.9)
MONOS PCT: 6 %
Neutro Abs: 5.3 10*3/uL (ref 1.4–6.5)
Neutrophils Relative %: 64 %
Platelets: 280 10*3/uL (ref 150–440)
RBC: 4.55 MIL/uL (ref 3.80–5.20)
RDW: 13.7 % (ref 11.5–14.5)
WBC: 8.3 10*3/uL (ref 3.6–11.0)

## 2015-05-12 LAB — COMPREHENSIVE METABOLIC PANEL
ALK PHOS: 81 U/L (ref 38–126)
ALT: 18 U/L (ref 14–54)
AST: 19 U/L (ref 15–41)
Albumin: 3.5 g/dL (ref 3.5–5.0)
Anion gap: 8 (ref 5–15)
BUN: 15 mg/dL (ref 6–20)
CHLORIDE: 100 mmol/L — AB (ref 101–111)
CO2: 24 mmol/L (ref 22–32)
Calcium: 8.7 mg/dL — ABNORMAL LOW (ref 8.9–10.3)
Creatinine, Ser: 0.87 mg/dL (ref 0.44–1.00)
Glucose, Bld: 116 mg/dL — ABNORMAL HIGH (ref 65–99)
POTASSIUM: 3.9 mmol/L (ref 3.5–5.1)
SODIUM: 132 mmol/L — AB (ref 135–145)
TOTAL PROTEIN: 7.1 g/dL (ref 6.5–8.1)
Total Bilirubin: 0.5 mg/dL (ref 0.3–1.2)

## 2015-05-12 MED ORDER — HEPARIN SOD (PORK) LOCK FLUSH 100 UNIT/ML IV SOLN
INTRAVENOUS | Status: AC
  Filled 2015-05-12: qty 5

## 2015-05-12 MED ORDER — HEPARIN SOD (PORK) LOCK FLUSH 100 UNIT/ML IV SOLN
500.0000 [IU] | Freq: Once | INTRAVENOUS | Status: AC
  Administered 2015-05-12: 500 [IU] via INTRAVENOUS

## 2015-05-12 MED ORDER — SODIUM CHLORIDE 0.9 % IJ SOLN
10.0000 mL | Freq: Once | INTRAMUSCULAR | Status: AC
  Administered 2015-05-12: 10 mL via INTRAVENOUS
  Filled 2015-05-12: qty 10

## 2015-05-12 NOTE — Progress Notes (Signed)
Only concern patient offers today is both eyes getting red, itchy, and painful.

## 2015-05-13 ENCOUNTER — Encounter: Payer: Self-pay | Admitting: Internal Medicine

## 2015-05-13 ENCOUNTER — Other Ambulatory Visit: Payer: Medicaid Other

## 2015-05-16 NOTE — Progress Notes (Signed)
[place]  [phone]) [phone] Fax:(336) [phone]  ID: Amie Critchley OB: 1970/10/16  MR#: [id number]  NGE#:[id number]  Patient Care Team: Einar Pheasant, MD as PCP - General (Internal Medicine)  CHIEF COMPLAINT:  Chief Complaint  Patient presents with  . Lung Cancer    discuss CT results    INTERVAL HISTORY: Patient returns to clinic today for further evaluation and discussion of her imaging results. She  Is complaining of "itchy, red eyes" otherwise feels well. She has chronic shortness of breath and cough which is unchanged.  She has a good appetite and denies any nausea, vomiting, constipation, or diarrhea. She has no neurologic complaints. She denies any recent fevers. She has no chest pain. She has no urinary complaints. Patient offers no further specific complaints.  REVIEW OF SYSTEMS:   Review of Systems  Constitutional: Negative for fever, weight loss and malaise/fatigue.  Eyes: Positive for redness.  Respiratory: Positive for cough and shortness of breath.   Gastrointestinal: Negative.  Negative for nausea.  Musculoskeletal: Negative.   Neurological: Negative.  Negative for weakness.    As per HPI. Otherwise, a complete review of systems is negatve.  PAST MEDICAL HISTORY: Past Medical History  Diagnosis Date  . Lung cancer ([place]) 08/08/2014    chemo and rad tx     PAST SURGICAL HISTORY: Past Surgical History  Procedure Laterality Date  . Cholecystectomy      FAMILY HISTORY:  Reviewed and unchanged. No reported history of malignancy or chronic disease.     ADVANCED DIRECTIVES:    HEALTH MAINTENANCE: Social History  Substance Use Topics  . Smoking status: Current Some Day Smoker  . Smokeless tobacco: Never Used  . Alcohol Use: No     Colonoscopy:  PAP:  Bone density:  Lipid panel:  Allergies  Allergen Reactions  . No Known Allergies     Current Outpatient Prescriptions  Medication Sig Dispense Refill  . levothyroxine  (SYNTHROID, LEVOTHROID) 50 MCG tablet Take 50 mcg by mouth daily before breakfast.    . vitamin B-12 (CYANOCOBALAMIN) 1000 MCG tablet Take 1,000 mcg by mouth daily.     No current facility-administered medications for this visit.    OBJECTIVE: Filed Vitals:   05/12/15 1451  BP: 137/87  Pulse: 87  Temp: 97.6 F (36.4 C)  Resp: 18     Body mass index is 39.29 kg/(m^2).    ECOG FS:1 - Symptomatic but completely ambulatory  General: Well-developed, well-nourished, no acute distress. Eyes: anicteric sclera.  No injections or drainage. Chest wall: Port site without erythema. Mild tenderness to palpation. Lungs: Clear to auscultation bilaterally. Heart: Regular rate and rhythm. No rubs, murmurs, or gallops. Abdomen: Soft, nontender, nondistended. No organomegaly noted, normoactive bowel sounds. Musculoskeletal: No edema, cyanosis, or clubbing. Neuro: Alert, answering all questions appropriately. Cranial nerves grossly intact. Skin: No rashes or petechiae noted. Psych: Normal affect.    LAB RESULTS:  Lab Results  Component Value Date   NA 132* 05/12/2015   K 3.9 05/12/2015   CL 100* 05/12/2015   CO2 24 05/12/2015   GLUCOSE 116* 05/12/2015   BUN 15 05/12/2015   CREATININE 0.87 05/12/2015   CALCIUM 8.7* 05/12/2015   PROT 7.1 05/12/2015   ALBUMIN 3.5 05/12/2015   AST 19 05/12/2015   ALT 18 05/12/2015   ALKPHOS 81 05/12/2015   BILITOT 0.5 05/12/2015   GFRNONAA >60 05/12/2015   GFRAA >60 05/12/2015    Lab Results  Component Value Date   WBC 8.3 05/12/2015  NEUTROABS 5.3 05/12/2015   HGB 13.8 05/12/2015   HCT 41.1 05/12/2015   MCV 90.5 05/12/2015   PLT 280 05/12/2015     STUDIES: Ct Chest W Contrast  05/11/2015  CLINICAL DATA:  Stage IV small cell lung cancer with adrenal metastatic lesion. EXAM: CT CHEST, ABDOMEN, AND PELVIS WITH CONTRAST TECHNIQUE: Multidetector CT imaging of the chest, abdomen and pelvis was performed following the standard protocol during bolus  administration of intravenous contrast. CONTRAST:  165m OMNIPAQUE IOHEXOL 300 MG/ML  SOLN COMPARISON:  02/03/2015 FINDINGS: CT CHEST FINDINGS Mediastinum/Nodes: Right upper paratracheal node 1.5 cm in short axis on image 10 series 2, formerly 0.9 cm. Right lower paratracheal node 1.6 cm in short axis on image 21 series 2, formerly 0.8 cm. AP window lymph node 1.0 cm in short axis on image 22 series 2, previously 0.9 cm. Subcarinal node 1.3 cm in short axis on image 28 series 2, formerly 1.1 cm. Left infrahilar node 1.2 cm in short axis on image 27 series 2, formerly the same. Left anterior descending coronary artery atherosclerosis. Lungs/Pleura: Pattern of left upper lobe branching flat density stable from prior. Mosaic attenuation similar to prior, likely from chronic pulmonary embolus or small airways disease. Musculoskeletal: Unremarkable CT ABDOMEN PELVIS FINDINGS Hepatobiliary: Cholecystectomy Pancreas: Unremarkable Spleen: Unremarkable Adrenals/Urinary Tract: Unremarkable Stomach/Bowel: Gas outlines appearance stool in the cecum this unlikely to represent pneumatosis. Vascular/Lymphatic: Aortoiliac atherosclerotic vascular disease. A gastrohepatic ligament node 1.4 cm in short axis, stable by my measurements. Scattered small retroperitoneal lymph nodes. Reproductive: 10.6 by 12.7 by 14.8 cm cystic lesion with internal septation believed to be associated with the left ovary although mainly in the midline. Uterus unremarkable. Questionable nodularity along the cystic mass. Other: No supplemental non-categorized findings. Musculoskeletal: Right paracentral ventral hernia contains adipose tissue, image 62 series 2, similar to prior. 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 There is mild right foraminal stenosis at L4-5 due to a right foraminal disc protrusion. IMPRESSION: 1. Increasing adenopathy in the mediastinum compatible with malignancy. Stable abnormally enlarged gastrohepatic lymph node. 2.  Previous 5.4 cm left adnexal cystic lesion has significantly enlarged, currently measuring 14.8 by 10.6 by 12.7 cm, with internal septation noted. Cystic ovarian malignancy is not excluded. Pelvic sonography recommended. 3. Other imaging findings of potential clinical significance: Left upper lobe scarring similar to prior. Mosaic attenuation similar to prior, possibly from small airways disease or chronic pulmonary embolus. 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 Right foraminal stenosis at L4-5 due to right foraminal disc protrusion. Stable right paracentral ventral hernia. Aortoiliac atherosclerotic vascular disease. Left anterior descending coronary artery atherosclerosis. Electronically Signed   By: WVan ClinesM.D.   On: 05/11/2015 16:33   Ct Abdomen Pelvis W Contrast  05/11/2015  CLINICAL DATA:  Stage IV small cell lung cancer with adrenal metastatic lesion. EXAM: CT CHEST, ABDOMEN, AND PELVIS WITH CONTRAST TECHNIQUE: Multidetector CT imaging of the chest, abdomen and pelvis was performed following the standard protocol during bolus administration of intravenous contrast. CONTRAST:  1092mOMNIPAQUE IOHEXOL 300 MG/ML  SOLN COMPARISON:  02/03/2015 FINDINGS: CT CHEST FINDINGS Mediastinum/Nodes: Right upper paratracheal node 1.5 cm in short axis on image 10 series 2, formerly 0.9 cm. Right lower paratracheal node 1.6 cm in short axis on image 21 series 2, formerly 0.8 cm. AP window lymph node 1.0 cm in short axis on image 22 series 2, previously 0.9 cm. Subcarinal node 1.3 cm in short axis on image 28 series 2, formerly 1.1 cm.  Left infrahilar node 1.2 cm in short axis on image 27 series 2, formerly the same. Left anterior descending coronary artery atherosclerosis. Lungs/Pleura: Pattern of left upper lobe branching flat density stable from prior. Mosaic attenuation similar to prior, likely from chronic pulmonary embolus or small airways disease. Musculoskeletal: Unremarkable CT ABDOMEN PELVIS FINDINGS  Hepatobiliary: Cholecystectomy Pancreas: Unremarkable Spleen: Unremarkable Adrenals/Urinary Tract: Unremarkable Stomach/Bowel: Gas outlines appearance stool in the cecum this unlikely to represent pneumatosis. Vascular/Lymphatic: Aortoiliac atherosclerotic vascular disease. A gastrohepatic ligament node 1.4 cm in short axis, stable by my measurements. Scattered small retroperitoneal lymph nodes. Reproductive: 10.6 by 12.7 by 14.8 cm cystic lesion with internal septation believed to be associated with the left ovary although mainly in the midline. Uterus unremarkable. Questionable nodularity along the cystic mass. Other: No supplemental non-categorized findings. Musculoskeletal: Right paracentral ventral hernia contains adipose tissue, image 62 series 2, similar to prior. 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 There is mild right foraminal stenosis at L4-5 due to a right foraminal disc protrusion. IMPRESSION: 1. Increasing adenopathy in the mediastinum compatible with malignancy. Stable abnormally enlarged gastrohepatic lymph node. 2. Previous 5.4 cm left adnexal cystic lesion has significantly enlarged, currently measuring 14.8 by 10.6 by 12.7 cm, with internal septation noted. Cystic ovarian malignancy is not excluded. Pelvic sonography recommended. 3. Other imaging findings of potential clinical significance: Left upper lobe scarring similar to prior. Mosaic attenuation similar to prior, possibly from small airways disease or chronic pulmonary embolus. 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 Right foraminal stenosis at L4-5 due to right foraminal disc protrusion. Stable right paracentral ventral hernia. Aortoiliac atherosclerotic vascular disease. Left anterior descending coronary artery atherosclerosis. Electronically Signed   By: Van Clines M.D.   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   PLAN:    1. Small cell lung carcinoma:  CT  scan results reviewed independently and reported as above with concern for recurrence in her mediastinum. Will get PET scan of the next 1-2 weeks for further evaluation and to determine if salvage chemotherapy is necessary. Return to clinic 1-2 days after her imaging to discuss the results and potential treatment planning. Previously, patient refused MRI of the brain.   2. Pelvic mass: Significantly increased in size. In addition the PET scan, will get pelvic ultrasound for further evaluation. Consider referral to gynecology-oncology. 3. Cough: Continue current prescriptions as prescribed. 4. Leukopenia: Resolved. Monitor. 5. Eye "itchiness": Physical exam is normal, continue over-the-counter treatments as indicated.  Patient expressed understanding and was in agreement with this plan. She also understands that She can call clinic at any time with any questions, concerns, or complaints.   Small cell carcinoma of lung   Staging form: Lung, AJCC 7th Edition     Clinical stage from 10/17/2014: T4, N3, M1 - Signed by Lloyd Huger, MD on 10/17/2014   Lloyd Huger, MD   05/16/2015 7:58 AM

## 2015-05-22 ENCOUNTER — Other Ambulatory Visit: Payer: Self-pay | Admitting: Oncology

## 2015-05-22 DIAGNOSIS — N838 Other noninflammatory disorders of ovary, fallopian tube and broad ligament: Secondary | ICD-10-CM

## 2015-05-25 ENCOUNTER — Ambulatory Visit: Payer: Medicaid Other

## 2015-05-25 ENCOUNTER — Ambulatory Visit: Admission: RE | Admit: 2015-05-25 | Payer: Medicaid Other | Source: Ambulatory Visit

## 2015-05-30 ENCOUNTER — Ambulatory Visit: Payer: Medicaid Other | Admitting: Oncology

## 2015-05-30 ENCOUNTER — Other Ambulatory Visit: Payer: Medicaid Other

## 2015-06-02 ENCOUNTER — Ambulatory Visit
Admission: RE | Admit: 2015-06-02 | Discharge: 2015-06-02 | Disposition: A | Discharge status: Home / Self Care | Payer: Medicaid Other | Source: Ambulatory Visit | Attending: Oncology | Admitting: Oncology

## 2015-06-02 ENCOUNTER — Ambulatory Visit: Admission: RE | Admit: 2015-06-02 | Payer: Medicaid Other | Source: Ambulatory Visit

## 2015-06-02 DIAGNOSIS — N838 Other noninflammatory disorders of ovary, fallopian tube and broad ligament: Secondary | ICD-10-CM

## 2015-06-02 DIAGNOSIS — N839 Noninflammatory disorder of ovary, fallopian tube and broad ligament, unspecified: Secondary | ICD-10-CM | POA: Insufficient documentation

## 2015-06-02 DIAGNOSIS — C3490 Malignant neoplasm of unspecified part of unspecified bronchus or lung: Secondary | ICD-10-CM | POA: Insufficient documentation

## 2015-06-05 ENCOUNTER — Encounter: Payer: Self-pay | Admitting: *Deleted

## 2015-06-05 ENCOUNTER — Emergency Department
Admission: EM | Admit: 2015-06-05 | Discharge: 2015-06-05 | Disposition: A | Discharge status: Home / Self Care | Payer: Medicaid Other | Attending: Emergency Medicine | Admitting: Emergency Medicine

## 2015-06-05 DIAGNOSIS — R112 Nausea with vomiting, unspecified: Secondary | ICD-10-CM | POA: Diagnosis present

## 2015-06-05 DIAGNOSIS — Z79899 Other long term (current) drug therapy: Secondary | ICD-10-CM | POA: Insufficient documentation

## 2015-06-05 DIAGNOSIS — C3490 Malignant neoplasm of unspecified part of unspecified bronchus or lung: Secondary | ICD-10-CM | POA: Insufficient documentation

## 2015-06-05 DIAGNOSIS — F17200 Nicotine dependence, unspecified, uncomplicated: Secondary | ICD-10-CM | POA: Diagnosis not present

## 2015-06-05 DIAGNOSIS — D899 Disorder involving the immune mechanism, unspecified: Secondary | ICD-10-CM

## 2015-06-05 DIAGNOSIS — D849 Immunodeficiency, unspecified: Secondary | ICD-10-CM | POA: Diagnosis not present

## 2015-06-05 DIAGNOSIS — Z9225 Personal history of immunosupression therapy: Secondary | ICD-10-CM | POA: Diagnosis not present

## 2015-06-05 LAB — COMPREHENSIVE METABOLIC PANEL
ALT: 15 U/L (ref 14–54)
AST: 15 U/L (ref 15–41)
Albumin: 3.7 g/dL (ref 3.5–5.0)
Alkaline Phosphatase: 81 U/L (ref 38–126)
Anion gap: 7 (ref 5–15)
BUN: 26 mg/dL — ABNORMAL HIGH (ref 6–20)
CHLORIDE: 98 mmol/L — AB (ref 101–111)
CO2: 29 mmol/L (ref 22–32)
CREATININE: 1.06 mg/dL — AB (ref 0.44–1.00)
Calcium: 9.2 mg/dL (ref 8.9–10.3)
GFR calc non Af Amer: >60 mL/min (ref >60)
Glucose, Bld: 139 mg/dL — ABNORMAL HIGH (ref 65–99)
POTASSIUM: 4 mmol/L (ref 3.5–5.1)
SODIUM: 134 mmol/L — AB (ref 135–145)
Total Bilirubin: 0.4 mg/dL (ref 0.3–1.2)
Total Protein: 7.7 g/dL (ref 6.5–8.1)

## 2015-06-05 LAB — CBC WITH DIFFERENTIAL/PLATELET
Basophils Absolute: 0.1 10*3/uL (ref 0–0.1)
Basophils Relative: 1 %
Eosinophils Absolute: 0.1 10*3/uL (ref 0–0.7)
Eosinophils Relative: 1 %
HEMATOCRIT: 42.5 % (ref 35.0–47.0)
Hemoglobin: 14 g/dL (ref 12.0–16.0)
LYMPHS ABS: 1.3 10*3/uL (ref 1.0–3.6)
LYMPHS PCT: 13 %
MCH: 30.6 pg (ref 26.0–34.0)
MCHC: 32.9 g/dL (ref 32.0–36.0)
MCV: 93.1 fL (ref 80.0–100.0)
MONOS PCT: 4 %
Monocytes Absolute: 0.4 10*3/uL (ref 0.2–0.9)
NEUTROS PCT: 81 %
Neutro Abs: 8.6 10*3/uL — ABNORMAL HIGH (ref 1.4–6.5)
Platelets: 257 10*3/uL (ref 150–440)
RBC: 4.57 MIL/uL (ref 3.80–5.20)
RDW: 13.5 % (ref 11.5–14.5)
WBC: 10.6 10*3/uL (ref 3.6–11.0)

## 2015-06-05 MED ORDER — ONDANSETRON HCL 4 MG/2ML IJ SOLN
4.0000 mg | Freq: Once | INTRAMUSCULAR | Status: AC
  Administered 2015-06-05: 4 mg via INTRAVENOUS
  Filled 2015-06-05: qty 2

## 2015-06-05 MED ORDER — SODIUM CHLORIDE 0.9 % IV BOLUS (SEPSIS)
1000.0000 mL | INTRAVENOUS | Status: AC
  Administered 2015-06-05: 1000 mL via INTRAVENOUS

## 2015-06-05 MED ORDER — ONDANSETRON HCL 4 MG PO TABS: ORAL_TABLET | ORAL | Status: DC

## 2015-06-05 NOTE — ED Provider Notes (Signed)
I reevaluated the patient at 8:00. She reports her nausea significantly better and she feels greatly improved. I will discuss with on-call oncology but suspect patient is appropriate for discharge at this time.   Discussed with Dr. Rudean Hitt of heme-onc who agrees with plan  Lavonia Drafts, MD 06/05/15 [phone]

## 2015-06-05 NOTE — ED Provider Notes (Signed)
[facility] Emergency Department Provider Note  ____________________________________________  Time seen: Approximately 6:09 AM  I have reviewed the triage vital signs and the nursing notes.   HISTORY  Chief Complaint Nausea and Emesis    HPI Tammy Andersen is a 44 y.o. female with a history of lung cancer only undergoing every 2 week chemotherapy treatment under the supervision of Dr. Grayland Ormond.  Her last treatment was one week ago.  For the last 1-2 days she has felt generalized weakness, general malaise, and nausea, and then over the last several hours she has had 3 episodes of emesis.  She denies fever/chills, chest pain, shortness of breath, abdominal pain, diarrhea, dysuria.  She has had no recent viral symptoms.  She has been going through chemotherapy for 6-7 months and has never had this happen to her after chemotherapy treatment.   Past Medical History  Diagnosis Date  . Lung cancer ([place]) 08/08/2014    chemo and rad tx     Patient Active Problem List   Diagnosis Date Noted  . 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 Cholecystectomy      Current Outpatient Rx  Name  Route  Sig  Dispense  Refill  . levothyroxine (SYNTHROID, LEVOTHROID) 50 MCG tablet   Oral   Take 50 mcg by mouth daily before breakfast.         . ondansetron (ZOFRAN) 4 MG tablet      Take 1-2 tabs by mouth every 8 hours as needed for nausea/vomiting   30 tablet   0   . vitamin B-12 (CYANOCOBALAMIN) 1000 MCG tablet   Oral   Take 1,000 mcg by mouth daily.           Allergies Codeine and No known allergies  History reviewed. No pertinent family history.  Social History Social History  Substance Use Topics  . Smoking status: Current Some Day Smoker  . Smokeless tobacco: Never Used  . Alcohol Use: No    Review of Systems Constitutional: No fever/chills.  Generalized weakness and malaise over the  last 2 days Eyes: No visual changes. ENT: No sore throat. Cardiovascular: Denies chest pain. Respiratory: Denies shortness of breath. Gastrointestinal: No abdominal pain.  2-3 episodes of vomiting over the last several hours.  No diarrhea.  No constipation. Genitourinary: Negative for dysuria. Musculoskeletal: Negative for back pain. Skin: Negative for rash. Neurological: Negative for headaches, focal weakness or numbness.  10-point ROS otherwise negative.  ____________________________________________   PHYSICAL EXAM:  VITAL SIGNS: ED Triage Vitals  Enc Vitals Group     BP 06/05/15 0605 118/79 mmHg     Pulse Rate 06/05/15 0605 72     Resp 06/05/15 0605 185     Temp 06/05/15 0605 97.4 F (36.3 C)     Temp Source 06/05/15 0605 Oral     SpO2 06/05/15 0605 99 %     Weight 06/05/15 0605 180 lb (81.647 kg)     Height 06/05/15 0605 _0  (1.575 m)     Head Cir --      Peak Flow --      Pain Score 06/05/15 0607 2     Pain Loc --      Pain Edu? --      Excl. in [place]? --     Constitutional: Alert and oriented.  Ill appearing but nontoxic. Eyes: Conjunctivae are normal. PERRL. EOMI. Head: Atraumatic. Nose: No congestion/rhinnorhea.  Mouth/Throat: Mucous membranes are moist.  Oropharynx non-erythematous. Neck: No stridor.   Cardiovascular: Normal rate, regular rhythm. Grossly normal heart sounds.  Good peripheral circulation. Respiratory: Normal respiratory effort.  No retractions. Lungs CTAB. Gastrointestinal: Soft and nontender. No distention. No abdominal bruits. No CVA tenderness. Musculoskeletal: No lower extremity tenderness nor edema.  No joint effusions. Neurologic:  Normal speech and language. No gross focal neurologic deficits are appreciated.  Skin:  Skin is warm, dry and intact. No rash noted. Psychiatric: Mood and affect are normal. Speech and behavior are normal.  ____________________________________________   LABS (all labs ordered are listed, but only abnormal  results are displayed)  Labs Reviewed  CBC WITH DIFFERENTIAL/PLATELET - Abnormal; Notable for the following:    Neutro Abs 8.6 (*)    All other components within normal limits  CULTURE, BLOOD (ROUTINE X 2)  CULTURE, BLOOD (ROUTINE X 2)  COMPREHENSIVE METABOLIC PANEL  URINALYSIS COMPLETEWITH MICROSCOPIC ([facility] ONLY)   ____________________________________________  EKG  Not indicated ____________________________________________  RADIOLOGY   No results found.  ____________________________________________   PROCEDURES  Procedure(s) performed: None  Critical Care performed: No ____________________________________________   INITIAL IMPRESSION / ASSESSMENT AND PLAN / ED COURSE  Pertinent labs & imaging results that were available during my care of the patient were reviewed by me and considered in my medical decision making (see chart for details).  No fever/chills, but immunosuppressed about 1 week after chemo.  Atypical presentation for her.  Will perform lab evaluation including blood cultures, urine, met panel, CBC w/ diff.  No infectious symptoms or URI issues, will not obtain additional chest imaging at this time.  1L NS, Zofran, reassess after workup.  ----------------------------------------- 7:07 AM on 06/05/2015 -----------------------------------------  Transferring ED care to Dr. Corky Downs to follow up on labs and reassess.  ____________________________________________  FINAL CLINICAL IMPRESSION(S) / ED DIAGNOSES  Final diagnoses:  Nausea and vomiting, vomiting of unspecified type  Immunosuppressed status ([place])  Small cell carcinoma of lung, unspecified laterality ([place])      NEW MEDICATIONS STARTED DURING THIS VISIT:  New Prescriptions   ONDANSETRON (ZOFRAN) 4 MG TABLET    Take 1-2 tabs by mouth every 8 hours as needed for nausea/vomiting     Hinda Kehr, MD 06/05/15 [phone]

## 2015-06-05 NOTE — ED Notes (Signed)
Pt verbalized understanding of discharge instructions. NAD at this time. 

## 2015-06-05 NOTE — ED Notes (Signed)
Pt has Hx of lung CA, c/o N/V, emesis x2 last PM, had chemo about a week ago.

## 2015-06-05 NOTE — Discharge Instructions (Signed)
You were seen today for nausea and vomiting.  Your labs were all reassuring and you are given a liter of fluid.  Please follow-up with your regular doctor at the next available opportunity as well as with Dr. Grayland Ormond.  Return to the emergency department with new or worsening symptoms that concern you.   Nausea and Vomiting Nausea is a sick feeling that often comes before throwing up (vomiting). Vomiting is a reflex where stomach contents come out of your mouth. Vomiting can cause severe loss of body fluids (dehydration). Children and elderly adults can become dehydrated quickly, especially if they also have diarrhea. Nausea and vomiting are symptoms of a condition or disease. It is important to find the cause of your symptoms. CAUSES   Direct irritation of the stomach lining. This irritation can result from increased acid production (gastroesophageal reflux disease), infection, food poisoning, taking certain medicines (such as nonsteroidal anti-inflammatory drugs), alcohol use, or tobacco use.  Signals from the brain.These signals could be caused by a headache, heat exposure, an inner ear disturbance, increased pressure in the brain from injury, infection, a tumor, or a concussion, pain, emotional stimulus, or metabolic problems.  An obstruction in the gastrointestinal tract (bowel obstruction).  Illnesses such as diabetes, hepatitis, gallbladder problems, appendicitis, kidney problems, cancer, sepsis, atypical symptoms of a heart attack, or eating disorders.  Medical treatments such as chemotherapy and radiation.  Receiving medicine that makes you sleep (general anesthetic) during surgery. DIAGNOSIS Your caregiver may ask for tests to be done if the problems do not improve after a few days. Tests may also be done if symptoms are severe or if the reason for the nausea and vomiting is not clear. Tests may include:  Urine tests.  Blood tests.  Stool tests.  Cultures (to look for evidence  of infection).  X-rays or other imaging studies. Test results can help your caregiver make decisions about treatment or the need for additional tests. TREATMENT You need to stay well hydrated. Drink frequently but in small amounts.You may wish to drink water, sports drinks, clear broth, or eat frozen ice pops or gelatin dessert to help stay hydrated.When you eat, eating slowly may help prevent nausea.There are also some antinausea medicines that may help prevent nausea. HOME CARE INSTRUCTIONS   Take all medicine as directed by your caregiver.  If you do not have an appetite, do not force yourself to eat. However, you must continue to drink fluids.  If you have an appetite, eat a normal diet unless your caregiver tells you differently.  Eat a variety of complex carbohydrates (rice, wheat, potatoes, bread), lean meats, yogurt, fruits, and vegetables.  Avoid high-fat foods because they are more difficult to digest.  Drink enough water and fluids to keep your urine clear or pale yellow.  If you are dehydrated, ask your caregiver for specific rehydration instructions. Signs of dehydration may include:  Severe thirst.  Dry lips and mouth.  Dizziness.  Dark urine.  Decreasing urine frequency and amount.  Confusion.  Rapid breathing or pulse. SEEK IMMEDIATE MEDICAL CARE IF:   You have blood or brown flecks (like coffee grounds) in your vomit.  You have black or bloody stools.  You have a severe headache or stiff neck.  You are confused.  You have severe abdominal pain.  You have chest pain or trouble breathing.  You do not urinate at least once every 8 hours.  You develop cold or clammy skin.  You continue to vomit for longer than 24  to 48 hours.  You have a fever. MAKE SURE YOU:   Understand these instructions.  Will watch your condition.  Will get help right away if you are not doing well or get worse.   This information is not intended to replace advice  given to you by your health care provider. Make sure you discuss any questions you have with your health care provider.   Document Released: 06/11/2005 Document Revised: 09/03/2011 Document Reviewed: 11/08/2010 Elsevier Interactive Patient Education [employer].

## 2015-06-06 ENCOUNTER — Inpatient Hospital Stay: Payer: Medicaid Other

## 2015-06-06 ENCOUNTER — Inpatient Hospital Stay: Payer: Medicaid Other | Admitting: Oncology

## 2015-06-09 ENCOUNTER — Other Ambulatory Visit: Payer: Self-pay | Admitting: *Deleted

## 2015-06-09 ENCOUNTER — Ambulatory Visit
Admission: RE | Admit: 2015-06-09 | Discharge: 2015-06-09 | Disposition: A | Discharge status: Home / Self Care | Payer: Medicaid Other | Source: Ambulatory Visit | Attending: Oncology | Admitting: Oncology

## 2015-06-09 DIAGNOSIS — C3490 Malignant neoplasm of unspecified part of unspecified bronchus or lung: Secondary | ICD-10-CM

## 2015-06-10 LAB — CULTURE, BLOOD (ROUTINE X 2)
CULTURE: NO GROWTH
CULTURE: NO GROWTH

## 2015-06-13 ENCOUNTER — Ambulatory Visit: Payer: Medicaid Other | Admitting: Oncology

## 2015-06-13 ENCOUNTER — Inpatient Hospital Stay: Payer: Medicaid Other | Attending: Oncology

## 2015-06-13 DIAGNOSIS — Z9221 Personal history of antineoplastic chemotherapy: Secondary | ICD-10-CM | POA: Insufficient documentation

## 2015-06-13 DIAGNOSIS — Z923 Personal history of irradiation: Secondary | ICD-10-CM | POA: Diagnosis not present

## 2015-06-13 DIAGNOSIS — C7970 Secondary malignant neoplasm of unspecified adrenal gland: Secondary | ICD-10-CM | POA: Insufficient documentation

## 2015-06-13 DIAGNOSIS — C3490 Malignant neoplasm of unspecified part of unspecified bronchus or lung: Secondary | ICD-10-CM | POA: Insufficient documentation

## 2015-06-13 LAB — HCG, QUANTITATIVE, PREGNANCY

## 2015-06-14 LAB — CA 125: CA 125: 9.4 U/mL (ref 0.0–38.1)

## 2015-06-16 ENCOUNTER — Ambulatory Visit: Admission: RE | Admit: 2015-06-16 | Payer: Medicaid Other | Source: Ambulatory Visit

## 2015-06-23 ENCOUNTER — Inpatient Hospital Stay: Payer: Medicaid Other | Admitting: Oncology

## 2015-06-23 ENCOUNTER — Ambulatory Visit: Payer: Medicaid Other

## 2015-06-23 ENCOUNTER — Inpatient Hospital Stay: Payer: Medicaid Other

## 2015-07-04 ENCOUNTER — Inpatient Hospital Stay
Admission: EM | Admit: 2015-07-04 | Discharge: 2015-07-07 | DRG: 054 | Disposition: A | Discharge status: Home / Self Care | Payer: Medicaid Other | Attending: Internal Medicine | Admitting: Internal Medicine

## 2015-07-04 ENCOUNTER — Inpatient Hospital Stay: Payer: Medicaid Other | Admitting: Oncology

## 2015-07-04 ENCOUNTER — Encounter: Payer: Self-pay | Admitting: Emergency Medicine

## 2015-07-04 ENCOUNTER — Emergency Department: Payer: Medicaid Other

## 2015-07-04 ENCOUNTER — Other Ambulatory Visit: Payer: Self-pay

## 2015-07-04 ENCOUNTER — Inpatient Hospital Stay: Payer: Medicaid Other | Attending: Oncology

## 2015-07-04 DIAGNOSIS — N839 Noninflammatory disorder of ovary, fallopian tube and broad ligament, unspecified: Secondary | ICD-10-CM | POA: Diagnosis present

## 2015-07-04 DIAGNOSIS — G9341 Metabolic encephalopathy: Secondary | ICD-10-CM | POA: Diagnosis present

## 2015-07-04 DIAGNOSIS — G936 Cerebral edema: Secondary | ICD-10-CM | POA: Diagnosis present

## 2015-07-04 DIAGNOSIS — Z9049 Acquired absence of other specified parts of digestive tract: Secondary | ICD-10-CM

## 2015-07-04 DIAGNOSIS — F17210 Nicotine dependence, cigarettes, uncomplicated: Secondary | ICD-10-CM | POA: Diagnosis present

## 2015-07-04 DIAGNOSIS — C3490 Malignant neoplasm of unspecified part of unspecified bronchus or lung: Secondary | ICD-10-CM | POA: Diagnosis present

## 2015-07-04 DIAGNOSIS — C7931 Secondary malignant neoplasm of brain: Principal | ICD-10-CM | POA: Diagnosis present

## 2015-07-04 DIAGNOSIS — E039 Hypothyroidism, unspecified: Secondary | ICD-10-CM | POA: Diagnosis present

## 2015-07-04 DIAGNOSIS — Z8249 Family history of ischemic heart disease and other diseases of the circulatory system: Secondary | ICD-10-CM | POA: Diagnosis not present

## 2015-07-04 DIAGNOSIS — R1900 Intra-abdominal and pelvic swelling, mass and lump, unspecified site: Secondary | ICD-10-CM | POA: Diagnosis not present

## 2015-07-04 DIAGNOSIS — N39498 Other specified urinary incontinence: Secondary | ICD-10-CM | POA: Diagnosis present

## 2015-07-04 DIAGNOSIS — R7301 Impaired fasting glucose: Secondary | ICD-10-CM | POA: Diagnosis present

## 2015-07-04 DIAGNOSIS — R531 Weakness: Secondary | ICD-10-CM | POA: Diagnosis not present

## 2015-07-04 DIAGNOSIS — Z716 Tobacco abuse counseling: Secondary | ICD-10-CM | POA: Diagnosis not present

## 2015-07-04 DIAGNOSIS — E876 Hypokalemia: Secondary | ICD-10-CM | POA: Diagnosis present

## 2015-07-04 DIAGNOSIS — Z9119 Patient's noncompliance with other medical treatment and regimen: Secondary | ICD-10-CM | POA: Diagnosis not present

## 2015-07-04 DIAGNOSIS — Z923 Personal history of irradiation: Secondary | ICD-10-CM | POA: Diagnosis not present

## 2015-07-04 DIAGNOSIS — Z886 Allergy status to analgesic agent status: Secondary | ICD-10-CM | POA: Diagnosis not present

## 2015-07-04 DIAGNOSIS — Z85118 Personal history of other malignant neoplasm of bronchus and lung: Secondary | ICD-10-CM | POA: Diagnosis not present

## 2015-07-04 DIAGNOSIS — R443 Hallucinations, unspecified: Secondary | ICD-10-CM | POA: Diagnosis not present

## 2015-07-04 DIAGNOSIS — Z9221 Personal history of antineoplastic chemotherapy: Secondary | ICD-10-CM

## 2015-07-04 DIAGNOSIS — R410 Disorientation, unspecified: Secondary | ICD-10-CM

## 2015-07-04 DIAGNOSIS — Z79899 Other long term (current) drug therapy: Secondary | ICD-10-CM

## 2015-07-04 HISTORY — DX: Hypothyroidism, unspecified: E03.9

## 2015-07-04 LAB — URINALYSIS COMPLETE WITH MICROSCOPIC (ARMC ONLY)
BILIRUBIN URINE: NEGATIVE
Glucose, UA: NEGATIVE mg/dL
HGB URINE DIPSTICK: NEGATIVE
KETONES UR: NEGATIVE mg/dL
LEUKOCYTES UA: NEGATIVE
Nitrite: POSITIVE — AB
PH: 7 (ref 5.0–8.0)
Protein, ur: 30 mg/dL — AB
Specific Gravity, Urine: 1.056 — ABNORMAL HIGH (ref 1.005–1.030)

## 2015-07-04 LAB — CBC
HCT: 45.4 % (ref 35.0–47.0)
Hemoglobin: 15.2 g/dL (ref 12.0–16.0)
MCH: 31.1 pg (ref 26.0–34.0)
MCHC: 33.5 g/dL (ref 32.0–36.0)
MCV: 92.8 fL (ref 80.0–100.0)
PLATELETS: 274 10*3/uL (ref 150–440)
RBC: 4.89 MIL/uL (ref 3.80–5.20)
RDW: 13.5 % (ref 11.5–14.5)
WBC: 9.6 10*3/uL (ref 3.6–11.0)

## 2015-07-04 LAB — BASIC METABOLIC PANEL
ANION GAP: 12 (ref 5–15)
BUN: 22 mg/dL — ABNORMAL HIGH (ref 6–20)
CHLORIDE: 91 mmol/L — AB (ref 101–111)
CO2: 34 mmol/L — ABNORMAL HIGH (ref 22–32)
CREATININE: 1.1 mg/dL — AB (ref 0.44–1.00)
Calcium: 9.7 mg/dL (ref 8.9–10.3)
GFR calc non Af Amer: 60 mL/min — ABNORMAL LOW (ref >60)
Glucose, Bld: 121 mg/dL — ABNORMAL HIGH (ref 65–99)
POTASSIUM: 3.4 mmol/L — AB (ref 3.5–5.1)
SODIUM: 137 mmol/L (ref 135–145)

## 2015-07-04 LAB — TSH: TSH: 18.683 u[IU]/mL — AB (ref 0.350–4.500)

## 2015-07-04 LAB — GLUCOSE, CAPILLARY: GLUCOSE-CAPILLARY: 131 mg/dL — AB (ref 65–99)

## 2015-07-04 MED ORDER — IOHEXOL 300 MG/ML  SOLN
75.0000 mL | Freq: Once | INTRAMUSCULAR | Status: AC | PRN
  Administered 2015-07-04: 75 mL via INTRAVENOUS

## 2015-07-04 MED ORDER — ONDANSETRON HCL 4 MG PO TABS: 4.0000 mg | ORAL_TABLET | Freq: Four times a day (QID) | ORAL | Status: DC | PRN

## 2015-07-04 MED ORDER — INSULIN ASPART 100 UNIT/ML ~~LOC~~ SOLN
0.0000 [IU] | Freq: Three times a day (TID) | SUBCUTANEOUS | Status: DC
  Administered 2015-07-06 – 2015-07-07 (×4): 1 [IU] via SUBCUTANEOUS
  Filled 2015-07-04 (×4): qty 1

## 2015-07-04 MED ORDER — DEXAMETHASONE SODIUM PHOSPHATE 10 MG/ML IJ SOLN
10.0000 mg | Freq: Four times a day (QID) | INTRAMUSCULAR | Status: DC
  Administered 2015-07-04 – 2015-07-06 (×6): 10 mg via INTRAVENOUS
  Filled 2015-07-04 (×6): qty 1

## 2015-07-04 MED ORDER — NICOTINE 7 MG/24HR TD PT24
7.0000 mg | MEDICATED_PATCH | Freq: Every day | TRANSDERMAL | Status: DC
  Administered 2015-07-04 – 2015-07-07 (×4): 7 mg via TRANSDERMAL
  Filled 2015-07-04 (×4): qty 1

## 2015-07-04 MED ORDER — POTASSIUM CHLORIDE IN NACL 20-0.9 MEQ/L-% IV SOLN
INTRAVENOUS | Status: DC
  Administered 2015-07-04 – 2015-07-05 (×2): via INTRAVENOUS
  Filled 2015-07-04 (×2): qty 1000

## 2015-07-04 MED ORDER — LEVETIRACETAM 500 MG PO TABS
ORAL_TABLET | ORAL | Status: AC
  Administered 2015-07-04: 500 mg via ORAL
  Filled 2015-07-04: qty 1

## 2015-07-04 MED ORDER — SENNOSIDES-DOCUSATE SODIUM 8.6-50 MG PO TABS: 1.0000 | ORAL_TABLET | Freq: Every evening | ORAL | Status: DC | PRN

## 2015-07-04 MED ORDER — LEVETIRACETAM 500 MG PO TABS
500.0000 mg | ORAL_TABLET | Freq: Two times a day (BID) | ORAL | Status: DC
  Administered 2015-07-05 – 2015-07-07 (×5): 500 mg via ORAL
  Filled 2015-07-04 (×7): qty 1

## 2015-07-04 MED ORDER — LEVOTHYROXINE SODIUM 50 MCG PO TABS
50.0000 ug | ORAL_TABLET | Freq: Every day | ORAL | Status: DC
  Administered 2015-07-05: 09:00:00 50 ug via ORAL
  Filled 2015-07-04: qty 1

## 2015-07-04 MED ORDER — LEVETIRACETAM 500 MG PO TABS
500.0000 mg | ORAL_TABLET | Freq: Once | ORAL | Status: AC
  Administered 2015-07-04: 500 mg via ORAL

## 2015-07-04 MED ORDER — SODIUM CHLORIDE 0.9 % IV BOLUS (SEPSIS)
1000.0000 mL | Freq: Once | INTRAVENOUS | Status: AC
  Administered 2015-07-04: 1000 mL via INTRAVENOUS

## 2015-07-04 MED ORDER — VITAMIN B-12 1000 MCG PO TABS
1000.0000 ug | ORAL_TABLET | Freq: Every day | ORAL | Status: DC
  Administered 2015-07-05 – 2015-07-06 (×2): 1000 ug via ORAL
  Filled 2015-07-04 (×2): qty 1

## 2015-07-04 MED ORDER — ACETAMINOPHEN 325 MG PO TABS
650.0000 mg | ORAL_TABLET | Freq: Four times a day (QID) | ORAL | Status: DC | PRN
  Administered 2015-07-06: 650 mg via ORAL
  Filled 2015-07-04: qty 2

## 2015-07-04 MED ORDER — TRAMADOL HCL 50 MG PO TABS: 50.0000 mg | ORAL_TABLET | Freq: Three times a day (TID) | ORAL | Status: DC | PRN

## 2015-07-04 MED ORDER — ACETAMINOPHEN 500 MG PO TABS
1000.0000 mg | ORAL_TABLET | Freq: Once | ORAL | Status: AC
  Administered 2015-07-04: 1000 mg via ORAL
  Filled 2015-07-04: qty 2

## 2015-07-04 MED ORDER — ACETAMINOPHEN 650 MG RE SUPP: 650.0000 mg | Freq: Four times a day (QID) | RECTAL | Status: DC | PRN

## 2015-07-04 MED ORDER — INSULIN ASPART 100 UNIT/ML ~~LOC~~ SOLN: 0.0000 [IU] | Freq: Every day | SUBCUTANEOUS | Status: DC

## 2015-07-04 MED ORDER — DEXAMETHASONE SODIUM PHOSPHATE 10 MG/ML IJ SOLN
10.0000 mg | INTRAMUSCULAR | Status: AC
  Administered 2015-07-04: 10 mg via INTRAVENOUS
  Filled 2015-07-04: qty 1

## 2015-07-04 MED ORDER — ONDANSETRON HCL 4 MG/2ML IJ SOLN
4.0000 mg | Freq: Four times a day (QID) | INTRAMUSCULAR | Status: DC | PRN
  Filled 2015-07-04: qty 2

## 2015-07-04 NOTE — H&P (Signed)
[place] [facility] [place] NAME: Tammy Andersen    MR#:  [id number]  DATE OF BIRTH:  12/26/1970  DATE OF ADMISSION:  07/04/2015  PRIMARY CARE PHYSICIAN: Einar Pheasant, MD   REQUESTING/REFERRING PHYSICIAN: Dr. Delman Kitten  CHIEF COMPLAINT:   Chief Complaint  Patient presents with  . Weakness  . Urinary Incontinence    HISTORY OF PRESENT ILLNESS:  Tammy Andersen  is a 45 y.o. female with a known history of lung cancer. She presents with headache going on for weeks. She is also lightheaded dizzy and off balance with walking. She's been vomiting on and off for the last few weeks. She's also had some confusion and possible hallucinations. She can't control her urine. She's also had some blurry vision. In the ER a CT scan of the head with contrast shows multiple metastatic lesions and a 7 mm midline shift. Hospitalist services were contacted for further evaluation  PAST MEDICAL HISTORY:   Past Medical History  Diagnosis Date  . Lung cancer ([place]) 08/08/2014    chemo and rad tx   . Hypothyroidism     PAST SURGICAL HISTORY:   Past Surgical History  Procedure Laterality Date  . Cholecystectomy    . Portacath placement      SOCIAL HISTORY:   Social History  Substance Use Topics  . Smoking status: Current Some Day Smoker -- 0.25 packs/day  . Smokeless tobacco: Never Used  . Alcohol Use: No    FAMILY HISTORY:   Family History  Problem Relation Age of Onset  . Heart disease Father     DRUG ALLERGIES:   Allergies  Allergen Reactions  . Codeine Hives    REVIEW OF SYSTEMS:  CONSTITUTIONAL: Positive for fever and hot and cold feeling. Positive for fatigue.  EYES: Positive for blurred vision EARS, NOSE, AND THROAT: No tinnitus or ear pain. No sore throat. Positive for runny nose RESPIRATORY: Occasional cough. No shortness of breath, wheezing or hemoptysis.  CARDIOVASCULAR: No chest pain, orthopnea, edema.  GASTROINTESTINAL:  Positive for nausea, vomiting and abdominal pain. No blood in bowel movements GENITOURINARY: No dysuria, hematuria.  ENDOCRINE: No polyuria, nocturia,  HEMATOLOGY: No anemia, easy bruising or bleeding SKIN: No rash or lesion. MUSCULOSKELETAL: No joint pain or arthritis.   NEUROLOGIC: Positive for numbness on the feet PSYCHIATRY: No anxiety or depression.   MEDICATIONS AT HOME:   Prior to Admission medications   Medication Sig Start Date End Date Taking? Authorizing Provider  levothyroxine (SYNTHROID, LEVOTHROID) 50 MCG tablet Take 50 mcg by mouth daily before breakfast.   Yes Historical Provider, MD  ondansetron (ZOFRAN) 4 MG tablet Take 4 mg by mouth every 8 (eight) hours as needed for nausea or vomiting.   Yes Historical Provider, MD  traMADol (ULTRAM) 50 MG tablet Take 50 mg by mouth every 8 (eight) hours as needed for moderate pain.   Yes Historical Provider, MD  vitamin B-12 (CYANOCOBALAMIN) 1000 MCG tablet Take 1,000 mcg by mouth daily.   Yes Historical Provider, MD      VITAL SIGNS:  Blood pressure 122/83, pulse 73, temperature 98.5 F (36.9 C), temperature source Oral, resp. rate 21, height '5\' 2"'$  (1.575 m), weight 96.163 kg (212 lb), last menstrual period 06/03/2015, SpO2 94 %.  PHYSICAL EXAMINATION:  GENERAL:  45 y.o.-year-old patient lying in the bed with no acute distress.  EYES: Pupils equal, round, reactive to light and accommodation. No scleral icterus. Extraocular muscles intact.  HEENT: Head atraumatic, normocephalic.  Oropharynx and nasopharynx clear.  NECK:  Supple, no jugular venous distention. No thyroid enlargement, no tenderness.  LUNGS: Normal breath sounds bilaterally, no wheezing, rales,rhonchi or crepitation. No use of accessory muscles of respiration.  CARDIOVASCULAR: S1, S2 normal. No murmurs, rubs, or gallops.  ABDOMEN: Soft, nontender, nondistended. Bowel sounds present. No organomegaly or mass.  EXTREMITIES: No pedal edema, cyanosis, or clubbing.   NEUROLOGIC: Cranial nerves II through XII are intact. Muscle strength 5/5 in all extremities. Numbness on the feet to light touch. Babinski upgoing bilaterally. Reflexes half plus bilateral lower extremity. Finger nose intact but slow bilaterally PSYCHIATRIC: The patient is alert and oriented x 3.  SKIN: No rash, lesion, or ulcer.   LABORATORY PANEL:   CBC  Recent Labs Lab 07/04/15 1033  WBC 9.6  HGB 15.2  HCT 45.4  PLT 274   ------------------------------------------------------------------------------------------------------------------  Chemistries   Recent Labs Lab 07/04/15 1033  NA 137  K 3.4*  CL 91*  CO2 34*  GLUCOSE 121*  BUN 22*  CREATININE 1.10*  CALCIUM 9.7   ------------------------------------------------------------------------------------------------------------------   RADIOLOGY:  Ct Head W Wo Contrast  07/04/2015  CLINICAL DATA:  Small cell lung carcinoma.  Headache and confusion EXAM: CT HEAD WITHOUT AND WITH CONTRAST TECHNIQUE: Contiguous axial images were obtained from the base of the skull through the vertex without and with intravenous contrast CONTRAST:  59m OMNIPAQUE IOHEXOL 300 MG/ML  SOLN COMPARISON:  CT head 09/08/2014 FINDINGS: Dramatic interval change since prior study. Extensive enhancing mass lesions are now present. No acute hemorrhage. Enhancing mass lesion in the right body the corpus callosum extending into the right frontal lobe measures 4 cm. Moderate surrounding white matter edema. Necrotic cystic mass in the right parietal lobe with enhancing wall measures 4.4 cm. Extensive enhancing tissue surrounding the lateral ventricles bilaterally extending into the ventricle. This enhancing tissue extends into the third ventricle and aqueduct. There is enhancing mass lesion involving the inferior fourth ventricle or inferior cerebellar vermis measuring 2.7 cm in diameter. The enhancing areas show homogeneous hyperdensity prior to contrast and  relatively intense enhancement aside from some necrosis in the right frontal and right parietal lesions. Ventricle slightly dilated.  7 mm midline shift to the left. No skull lesions identified. IMPRESSION: Extensive enhancing tumor is seen. Given history of small cell lung cancer, this is most likely due to metastatic disease. This unusual pattern could also be seen with intracranial lymphoma. Early hydrocephalus. No acute hemorrhage. 7 mm midline shift to the left. These results were called by telephone at the time of interpretation on 07/04/2015 at 1:59 pm to Dr. MDelman Kitten, who verbally acknowledged these results. Electronically Signed   By: CFranchot GalloM.D.   On: 07/04/2015 14:00    EKG:   Normal sinus rhythm 89 bpm  IMPRESSION AND PLAN:   1. 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 Patient has midline shift and multiple lesions on CT scan. Overall prognosis is poor. Patient is a full code. Decadron 10 mg 1 given already and I will continue that on a every 6 hours. Empiric Keppra to prevent seizure. Oncology and radiation oncology consultations. Obtain MRI of the brain. 2. Nausea vomiting- secondary to brain metastases. When necessary nausea medications 3. Hypokalemia replace potassium in IV fluids 4. Unsteady gait- get physical therapy evaluation 5. Tobacco abuse- smoking cessation counseling done 3 minutes by me and nicotine patch ordered 6. Impaired fasting glucose- put on sliding scale while on steroids 7. Confusion and possible  hallucinations- likely with brain metastases. 8. Hypothyroidism unspecified continue levothyroxine and check a TSH.  All the records are reviewed and case discussed with ED provider. Management plans discussed with the patient, family and they are in agreement. Case discussed with son at length  CODE STATUS: Full code  TOTAL TIME TAKING CARE OF THIS PATIENT: 55 minutes.    Loletha Grayer M.D on 07/04/2015 at 5:40 PM  Between 7am  to 6pm - Pager - [phone]  After 6pm call admission pager [facility]  Office  [phone]  CC: Primary care physician; Einar Pheasant, MD

## 2015-07-04 NOTE — ED Notes (Signed)
Pt called to [facility] RN took report and states pt may be transported at 12.

## 2015-07-04 NOTE — ED Notes (Signed)
fam member says pt has become weak and can barely walk over last few weeks.  Says she has been having headaches, incontinence and was scheduled at cancer center today but they were closed.

## 2015-07-04 NOTE — ED Provider Notes (Signed)
[facility] Emergency Department Provider Note  ____________________________________________  Time seen: Approximately 2:37 PM  I have reviewed the triage vital signs and the nursing notes.   HISTORY  Chief Complaint Weakness and Urinary Incontinence  HPI Tammy Andersen is a 45 y.o. female presents for evaluation of confusion, mild headache, trouble walking and difficulty with urination which have been worsening over the last 2 weeks.  She denies being in pain now but reports she is having intermittent headaches and her son reports that she is becoming increasingly more confused and trouble walking around the home.  No nausea vomiting. Not eating or drinking well. It's intermittent sharp headaches.  Patient has recurrence of her cancer, currently not receiving chemotherapy but is being evaluated by oncology here.  Past Medical History  Diagnosis Date  . Lung cancer ([place]) 08/08/2014    chemo and rad tx   . Hypothyroidism     Patient Active Problem List   Diagnosis Date Noted  . Brain metastases ([place]) 07/04/2015  . 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 Cholecystectomy    . Portacath placement      Current Outpatient Rx  Name  Route  Sig  Dispense  Refill  . levothyroxine (SYNTHROID, LEVOTHROID) 50 MCG tablet   Oral   Take 50 mcg by mouth daily before breakfast.         . ondansetron (ZOFRAN) 4 MG tablet   Oral   Take 4 mg by mouth every 8 (eight) hours as needed for nausea or vomiting.         . traMADol (ULTRAM) 50 MG tablet   Oral   Take 50 mg by mouth every 8 (eight) hours as needed for moderate pain.         . vitamin B-12 (CYANOCOBALAMIN) 1000 MCG tablet   Oral   Take 1,000 mcg by mouth daily.           Allergies Codeine  Family History  Problem Relation Age of Onset  . Heart disease Father     Social History Social History  Substance Use Topics  .  Smoking status: Current Some Day Smoker -- 0.25 packs/day  . Smokeless tobacco: Never Used  . Alcohol Use: No    Review of Systems Constitutional: No fever/chills Eyes: No visual changes. ENT: No sore throat. Cardiovascular: Denies chest pain. Respiratory: Denies shortness of breath. Gastrointestinal: No abdominal pain.  No nausea, no vomiting.  No diarrhea.  No constipation. Genitourinary: Negative for dysuria. Musculoskeletal: Negative for back pain. Skin: Negative for rash. Neurological: Negative for focal weakness or numbness.  10-point ROS otherwise negative.  ____________________________________________   PHYSICAL EXAM:  VITAL SIGNS: ED Triage Vitals  Enc Vitals Group     BP 07/04/15 1023 138/97 mmHg     Pulse Rate 07/04/15 1023 88     Resp 07/04/15 1023 16     Temp 07/04/15 1023 98.5 F (36.9 C)     Temp Source 07/04/15 1023 Oral     SpO2 07/04/15 1023 95 %     Weight 07/04/15 1023 212 lb (96.163 kg)     Height 07/04/15 1023 '5\' 2"'$  (1.575 m)     Head Cir --      Peak Flow --      Pain Score --      Pain Loc --      Pain Edu? --      Excl.  in [place]? --    Constitutional: Alert and oriented to self but reports it is March 2019 and does not recall events of earlier today. Chronically ill but in no distress. Eyes: Conjunctivae are normal. PERRL. EOMI. Head: Atraumatic. Nose: No congestion/rhinnorhea. Mouth/Throat: Mucous membranes are dry.  Oropharynx non-erythematous. Neck: No stridor.   Cardiovascular: Normal rate, regular rhythm. Grossly normal heart sounds.  Good peripheral circulation. Respiratory: Normal respiratory effort.  No retractions. Lungs CTAB. Gastrointestinal: Soft and nontender. No distention. No abdominal bruits. No CVA tenderness. Musculoskeletal: No lower extremity tenderness nor edema.  No joint effusions. Neurologic:  Normal speech and language. No gross focal neurologic deficits are appreciated. Skin:  Skin is warm, dry and intact. No rash  noted. Psychiatric: Mood and affect are calm and somewhat flat. Speech and behavior are normal.  ____________________________________________   LABS (all labs ordered are listed, but only abnormal results are displayed)  Labs Reviewed  BASIC METABOLIC PANEL - Abnormal; Notable for the following:    Potassium 3.4 (*)    Chloride 91 (*)    CO2 34 (*)    Glucose, Bld 121 (*)    BUN 22 (*)    Creatinine, Ser 1.10 (*)    GFR calc non Af Amer 60 (*)    All other components within normal limits  URINALYSIS COMPLETEWITH MICROSCOPIC ([facility] ONLY) - Abnormal; Notable for the following:    Color, Urine AMBER (*)    APPearance HAZY (*)    Specific Gravity, Urine 1.056 (*)    Protein, ur 30 (*)    Nitrite POSITIVE (*)    Bacteria, UA FEW (*)    Squamous Epithelial / LPF 0-5 (*)    All other components within normal limits  CBC  BASIC METABOLIC PANEL  CBC  TSH  HEMOGLOBIN A1C   ____________________________________________  EKG  ED ECG REPORT I, QUALE, MARK, the attending physician, personally viewed and interpreted this ECG.  Date: 07/04/2015 EKG Time: 12 5 PM Rate: 90 Rhythm: normal sinus rhythm QRS Axis: normal Intervals: normal ST/T Wave abnormalities: normal Conduction Disutrbances: none Narrative Interpretation: unremarkable  ____________________________________________  RADIOLOGY  IMPRESSION: Extensive enhancing tumor is seen. Given history of small cell lung cancer, this is most likely due to metastatic disease. This unusual pattern could also be seen with intracranial lymphoma.  Early hydrocephalus. No acute hemorrhage. 7 mm midline shift to the left.  These results were called by telephone at the time of interpretation on 07/04/2015 at 1:59 pm to Dr. Delman Kitten , who verbally acknowledged these results. ____________________________________________   PROCEDURES  Procedure(s) performed: None  Critical Care performed:  No  ____________________________________________   INITIAL IMPRESSION / ASSESSMENT AND PLAN / ED COURSE  Pertinent labs & imaging results that were available during my care of the patient were reviewed by me and considered in my medical decision making (see chart for details).  Discussed with Dr. Trenton Gammon of [facility] neurosurgery who advises high-dose steroids, and agrees with plan to transfer to [facility] recommends urgent evaluation for radiation therapy. Call the hospitalist service. Patient is agreeable with plan to transfer to [facility] for further evaluation by oncology and neurosurgery.  ----------------------------------------- 2:52 PM on 07/04/2015 -----------------------------------------  Patient continued to well. I am concerned about her ongoing confusion at this point suspect is likely secondary to what appears to be oncologic process involving the brain with tumors. We have placed transfer request to [facility].  Later discussed with Dr. Grayland Ormond, he advises that radiation oncology service is  available at [facility] and recommends admission here with IV Decadron and he will have radiation oncology evaluate as well as he will see the patient and consult tomorrow. Discussed patient and her family are agreeable. Patient will be admitted to [facility].   ____________________________________________   FINAL CLINICAL IMPRESSION(S) / ED DIAGNOSES  Final diagnoses:  Brain metastases [facility])  Disorientation      Delman Kitten, MD 07/04/15 1759

## 2015-07-05 ENCOUNTER — Inpatient Hospital Stay: Payer: Medicaid Other

## 2015-07-05 DIAGNOSIS — Z794 Long term (current) use of insulin: Secondary | ICD-10-CM

## 2015-07-05 DIAGNOSIS — C3490 Malignant neoplasm of unspecified part of unspecified bronchus or lung: Secondary | ICD-10-CM

## 2015-07-05 DIAGNOSIS — R32 Unspecified urinary incontinence: Secondary | ICD-10-CM

## 2015-07-05 DIAGNOSIS — C7931 Secondary malignant neoplasm of brain: Principal | ICD-10-CM

## 2015-07-05 DIAGNOSIS — E039 Hypothyroidism, unspecified: Secondary | ICD-10-CM

## 2015-07-05 DIAGNOSIS — Z79899 Other long term (current) drug therapy: Secondary | ICD-10-CM

## 2015-07-05 DIAGNOSIS — R531 Weakness: Secondary | ICD-10-CM

## 2015-07-05 DIAGNOSIS — R1900 Intra-abdominal and pelvic swelling, mass and lump, unspecified site: Secondary | ICD-10-CM

## 2015-07-05 DIAGNOSIS — R410 Disorientation, unspecified: Secondary | ICD-10-CM

## 2015-07-05 DIAGNOSIS — F17210 Nicotine dependence, cigarettes, uncomplicated: Secondary | ICD-10-CM

## 2015-07-05 DIAGNOSIS — E119 Type 2 diabetes mellitus without complications: Secondary | ICD-10-CM

## 2015-07-05 LAB — HEMOGLOBIN A1C: Hgb A1c MFr Bld: 5.5 % (ref 4.0–6.0)

## 2015-07-05 LAB — BASIC METABOLIC PANEL
ANION GAP: 7 (ref 5–15)
BUN: 22 mg/dL — ABNORMAL HIGH (ref 6–20)
CO2: 30 mmol/L (ref 22–32)
Calcium: 9 mg/dL (ref 8.9–10.3)
Chloride: 101 mmol/L (ref 101–111)
Creatinine, Ser: 0.92 mg/dL (ref 0.44–1.00)
GFR calc Af Amer: >60 mL/min (ref >60)
GFR calc non Af Amer: >60 mL/min (ref >60)
GLUCOSE: 117 mg/dL — AB (ref 65–99)
POTASSIUM: 4.2 mmol/L (ref 3.5–5.1)
Sodium: 138 mmol/L (ref 135–145)

## 2015-07-05 LAB — CBC
HEMATOCRIT: 40.2 % (ref 35.0–47.0)
HEMOGLOBIN: 13.5 g/dL (ref 12.0–16.0)
MCH: 31.2 pg (ref 26.0–34.0)
MCHC: 33.6 g/dL (ref 32.0–36.0)
MCV: 92.7 fL (ref 80.0–100.0)
Platelets: 232 10*3/uL (ref 150–440)
RBC: 4.34 MIL/uL (ref 3.80–5.20)
RDW: 13.9 % (ref 11.5–14.5)
WBC: 7.7 10*3/uL (ref 3.6–11.0)

## 2015-07-05 LAB — GLUCOSE, CAPILLARY
GLUCOSE-CAPILLARY: 120 mg/dL — AB (ref 65–99)
GLUCOSE-CAPILLARY: 137 mg/dL — AB (ref 65–99)
Glucose-Capillary: 106 mg/dL — ABNORMAL HIGH (ref 65–99)
Glucose-Capillary: 110 mg/dL — ABNORMAL HIGH (ref 65–99)

## 2015-07-05 MED ORDER — LORAZEPAM 2 MG/ML IJ SOLN
2.0000 mg | Freq: Once | INTRAMUSCULAR | Status: AC
  Administered 2015-07-05: 2 mg via INTRAVENOUS
  Filled 2015-07-05: qty 1

## 2015-07-05 MED ORDER — SENNOSIDES-DOCUSATE SODIUM 8.6-50 MG PO TABS
1.0000 | ORAL_TABLET | Freq: Two times a day (BID) | ORAL | Status: DC
  Administered 2015-07-05 – 2015-07-07 (×5): 1 via ORAL
  Filled 2015-07-05 (×5): qty 1

## 2015-07-05 MED ORDER — GADOBENATE DIMEGLUMINE 529 MG/ML IV SOLN
20.0000 mL | Freq: Once | INTRAVENOUS | Status: AC | PRN
  Administered 2015-07-05: 11:00:00 19 mL via INTRAVENOUS

## 2015-07-05 MED ORDER — ENOXAPARIN SODIUM 40 MG/0.4ML ~~LOC~~ SOLN
40.0000 mg | SUBCUTANEOUS | Status: DC
  Administered 2015-07-05 – 2015-07-06 (×2): 40 mg via SUBCUTANEOUS
  Filled 2015-07-05 (×2): qty 0.4

## 2015-07-05 MED ORDER — LEVOTHYROXINE SODIUM 112 MCG PO TABS
112.0000 ug | ORAL_TABLET | Freq: Every day | ORAL | Status: DC
  Administered 2015-07-06 – 2015-07-07 (×2): 112 ug via ORAL
  Filled 2015-07-05 (×3): qty 1

## 2015-07-05 NOTE — Plan of Care (Addendum)
VSS, afebrile. MRI scheduled for today. Oncology & Radiation Oncology consulted.  Problem: Safety: Goal: Ability to remain free from injury will improve Outcome: Progressing High fall risk. Bed alarm on, hourly rounding. Safe environment provided. Pt son at bedside, understands how to use call system for assistance for his mother.  Problem: Health Behavior/Discharge Planning: Goal: Ability to manage health-related needs will improve Outcome: Progressing Pt from home w/ son and mother.   Problem: Pain Managment: Goal: General experience of comfort will improve Outcome: Progressing No c/o pain since arriving to the unit. Resting comfortably through the night  Problem: Skin Integrity: Goal: Risk for impaired skin integrity will decrease Outcome: Progressing Moisture associated skin damage under breasts & groin. Cleanser & powder applied. Peri care completed.   Problem: Activity: Goal: Risk for activity intolerance will decrease Outcome: Progressing Pt very weak, requiring moderate assistance w/ movement in bed and to [facility]. Rest periods provided.

## 2015-07-05 NOTE — Evaluation (Signed)
Physical Therapy Evaluation Patient Details Name: Tammy Andersen MRN: [id number] DOB: 02-04-1971 Today's Date: 07/05/2015   History of Present Illness  Tammy Andersen is a 45 y.o. female with a known history of lung cancer. She presents with headache going on for weeks. She is also lightheaded dizzy and off balance with walking. Patient had CT scan which showed multiple metastatic lesions in brain with midline shift; This is likely due to small cell lung CA.   Clinical Impression  45 yo Female was brought to ED with increased headache and dizziness which has been going on for past 2 weeks. Patient's son, reports that she was independent in all ADLs with no limitations prior to admittance. She was walking without AD and demonstrated good safety awareness. Currently, patient demonstrates impaired balance, weakness, gait abnormality and decreased functional mobility. She was diagnosed with new metastatic lesions in brain with midline shift. Her clinical presentation is evolving. Patient is supervision for bed mobility, min A for sit<>Stand transfer with RW. She ambulated approximately 75 feet with RW, min-mod A (needed more assist with turns) with +2 for equipment. Patient demonstrates increased weakness in BLE (hip and ankles). She is often confused and not oriented to situation or date. She would benefit from additional skilled PT intervention to improve balance/gait safety and increase functional mobility. Her personal factors affecting rehab include: AMS, high fall risk, stairs to enter house, decreased knowledge of DME and progressing cancer.     Follow Up Recommendations Home health PT    Equipment Recommendations  Rolling walker with 5" wheels    Recommendations for Other Services       Precautions / Restrictions Precautions Precautions: Fall Restrictions Weight Bearing Restrictions: No      Mobility  Bed Mobility Overal bed mobility: Needs Assistance Bed Mobility: Supine to Sit      Supine to sit: Supervision     General bed mobility comments: requires min Vcs for hand placement and to improve trunk position for better mobility;   Transfers Overall transfer level: Needs assistance Equipment used: Rolling walker (2 wheeled) Transfers: Sit to/from Stand Sit to Stand: Min assist         General transfer comment: Patient is min A for sit<>stand transfer from bed; She does require min Vcs for correct hand placement; When trying to turn around and sit down in chair, patient required increased cues for turning and to stay close to RW for safety;   Ambulation/Gait Ambulation/Gait assistance: Min assist;+2 safety/equipment;Mod assist Ambulation Distance (Feet): 75 Feet Assistive device: Rolling walker (2 wheeled) Gait Pattern/deviations: Step-through pattern;Decreased step length - right;Decreased step length - left;Shuffle Gait velocity: decreased   General Gait Details: ambulates with reciprocal gait pattern but slower gait speed, decreased step length with shuffled steps; required mod Vcs for walker placement and to stay close to RW especially with turns. Patient required min VCs to keep walker on floor and to utilize walker to improve balance with gait tasks.   Stairs            Wheelchair Mobility    Modified Rankin (Stroke Patients Only)       Balance Overall balance assessment: Needs assistance Sitting-balance support: Single extremity supported Sitting balance-Leahy Scale: Good Sitting balance - Comments: pt able to keep balance with sitting with 1 HHA on bed;      Standing balance-Leahy Scale: Poor Standing balance comment: Patient required min-mod A during standing balance as she demonstrates increased posterior trunk lean; very unsteady with gait tasks with  RW;                              Pertinent Vitals/Pain Pain Assessment: No/denies pain    Home Living Family/patient expects to be discharged to:: Private  residence Living Arrangements: Children (son there during the day; ) Available Help at Discharge: Family Type of Home: House Home Access: Stairs to enter Entrance Stairs-Rails: Right;Left;Can reach both Technical brewer of Steps: 4-5 Home Layout: One level Home Equipment: Tub bench      Prior Function Level of Independence: Independent         Comments: son reports that up until 2 weeks ago patient was independent in all ADLs with no limitations; She was able to walk without AD, do all household chores etc.      Hand Dominance        Extremity/Trunk Assessment   Upper Extremity Assessment: Overall WFL for tasks assessed           Lower Extremity Assessment: Generalized weakness;RLE deficits/detail;LLE deficits/detail RLE Deficits / Details: hip grossly 4-/5, knee 4/5, ankle 3+/5; reports impaired light touch sensation; intact deep pressure;  LLE Deficits / Details: hip grossly 4-/5, knee 4/5, ankle 3+/5; reports impaired light touch sensation; intact deep pressure;      Communication   Communication: No difficulties  Cognition Arousal/Alertness: Awake/alert Behavior During Therapy: WFL for tasks assessed/performed Overall Cognitive Status: Impaired/Different from baseline Area of Impairment: Orientation Orientation Level: Disoriented to;Time;Situation             General Comments: Patient is confused. She states that she is here at the hospital to have a baby. She also believes that it is march    General Comments General comments (skin integrity, edema, etc.): intact by gross assessment    Exercises        Assessment/Plan    PT Assessment Patient needs continued PT services  PT Diagnosis Generalized weakness;Abnormality of gait;Difficulty walking   PT Problem List Decreased strength;Decreased cognition;Decreased knowledge of use of DME;Decreased activity tolerance;Decreased safety awareness;Decreased balance;Decreased mobility;Impaired  sensation  PT Treatment Interventions DME instruction;Balance training;Gait training;Neuromuscular re-education;Stair training;Functional mobility training;Therapeutic activities;Therapeutic exercise;Patient/family education   PT Goals (Current goals can be found in the Care Plan section) Acute Rehab PT Goals Patient Stated Goal: "to be able to go home and walk again" PT Goal Formulation: With patient Time For Goal Achievement: 07/19/15 Potential to Achieve Goals: Good    Frequency Min 2X/week   Barriers to discharge Inaccessible home environment has 4-5 stairs to negotiate; son is there during the day;     Co-evaluation               End of Session Equipment Utilized During Treatment: Gait belt Activity Tolerance: Patient tolerated treatment well;No increased pain Patient left: in chair;with call bell/phone within reach;with chair alarm set;with family/visitor present;with nursing/sitter in room Nurse Communication: Mobility status         Time: 7035-0093 PT Time Calculation (min) (ACUTE ONLY): 21 min   Charges:   PT Evaluation $PT Eval Moderate Complexity: 1 Procedure     PT G Codes:        Nicky Milhouse PT, DPT 07/05/2015, 10:37 AM

## 2015-07-05 NOTE — Consult Note (Signed)
[place]  [phone] Fax:(336) [phone]  ID: Tammy Andersen OB: 20-Feb-1971  MR#: [id number]  PIR#:[id number]  Patient Care Team: Einar Pheasant, MD as PCP - General (Internal Medicine)  CHIEF COMPLAINT:  Chief Complaint  Patient presents with  . Weakness  . Urinary Incontinence    INTERVAL HISTORY: Patient is a 45 year old female with known history of small cell lung cancer, who presented to the emergency room with confusion, disorientation, frequent follow-up, and urinary incontinence. Subsequent workup revealed multiple brain metastasis consistent with small cell carcinoma. Currently, patient is sedated after receiving Ativan for her MRI earlier. Much of the history is given by her son and mother. There is no reported fevers. Patient did not complain of chest pain, shortness of breath, cough, or hemoptysis. Patient offered no further complaints.  REVIEW OF SYSTEMS:   Review of Systems  Unable to perform ROS: acuity of condition    As per HPI. Otherwise, a complete review of systems is negatve.  PAST MEDICAL HISTORY: Past Medical History  Diagnosis Date  . Lung cancer ([place]) 08/08/2014    chemo and rad tx   . Hypothyroidism     PAST SURGICAL HISTORY: Past Surgical History  Procedure Laterality Date  . Cholecystectomy    . Portacath placement      FAMILY HISTORY Family History  Problem Relation Age of Onset  . Heart disease Father        ADVANCED DIRECTIVES:    HEALTH MAINTENANCE: Social History  Substance Use Topics  . Smoking status: Current Some Day Smoker -- 0.25 packs/day  . Smokeless tobacco: Never Used  . Alcohol Use: No     Colonoscopy:  PAP:  Bone density:  Lipid panel:  Allergies  Allergen Reactions  . Codeine Hives    Current Facility-Administered Medications  Medication Dose Route Frequency Provider Last Rate Last Dose  . acetaminophen (TYLENOL) tablet 650 mg  650 mg Oral Q6H PRN Loletha Grayer, MD        Or  . acetaminophen (TYLENOL) suppository 650 mg  650 mg Rectal Q6H PRN Loletha Grayer, MD      . dexamethasone (DECADRON) injection 10 mg  10 mg Intravenous 4 times per day Loletha Grayer, MD   10 mg at 07/05/15 1858  . enoxaparin (LOVENOX) injection 40 mg  40 mg Subcutaneous Q24H Darylene Price Bayard, RPH   40 mg at 07/05/15 1939  . insulin aspart (novoLOG) injection 0-5 Units  0-5 Units Subcutaneous QHS Loletha Grayer, MD   0 Units at 07/04/15 2214  . insulin aspart (novoLOG) injection 0-9 Units  0-9 Units Subcutaneous TID WC Loletha Grayer, MD   0 Units at 07/05/15 0757  . levETIRAcetam (KEPPRA) tablet 500 mg  500 mg Oral BID Loletha Grayer, MD   500 mg at 07/05/15 1939  . [START ON 07/06/2015] levothyroxine (SYNTHROID, LEVOTHROID) tablet 112 mcg  112 mcg Oral QAC breakfast Fritzi Mandes, MD      . nicotine (NICODERM CQ - dosed in mg/24 hr) patch 7 mg  7 mg Transdermal Daily Loletha Grayer, MD   7 mg at 07/05/15 0924  . ondansetron (ZOFRAN) tablet 4 mg  4 mg Oral Q6H PRN Loletha Grayer, MD       Or  . ondansetron [facility] injection 4 mg  4 mg Intravenous Q6H PRN Loletha Grayer, MD      . senna-docusate (Senokot-S) tablet 1 tablet  1 tablet Oral BID Lenis Noon, [facility]   1 tablet at 07/05/15 1939  .  traMADol (ULTRAM) tablet 50 mg  50 mg Oral Q8H PRN Loletha Grayer, MD      . vitamin B-12 (CYANOCOBALAMIN) tablet 1,000 mcg  1,000 mcg Oral Daily Loletha Grayer, MD   1,000 mcg at 07/05/15 0923    OBJECTIVE: Filed Vitals:   07/05/15 1207 07/05/15 1446  BP: 140/68 127/76  Pulse: 69 59  Temp: 98.6 F (37 C) 97.4 F (36.3 C)  Resp:  20     Body mass index is 37.67 kg/(m^2).    ECOG FS:3 - Symptomatic, >50% confined to bed  General: Ill-appearing, no acute distress. Eyes: Pink conjunctiva, anicteric sclera. HEENT: Normocephalic, moist mucous membranes, clear oropharnyx. Lungs: Clear to auscultation bilaterally. Heart: Regular rate and rhythm. No rubs, murmurs, or gallops. Abdomen: Soft,  nontender, nondistended. No organomegaly noted, normoactive bowel sounds. Musculoskeletal: No edema, cyanosis, or clubbing. Neuro: Sedated, confused. Cranial nerves grossly intact. Skin: No rashes or petechiae noted. Psych: Normal affect.   LAB RESULTS:  Lab Results  Component Value Date   NA 138 07/05/2015   K 4.2 07/05/2015   CL 101 07/05/2015   CO2 30 07/05/2015   GLUCOSE 117* 07/05/2015   BUN 22* 07/05/2015   CREATININE 0.92 07/05/2015   CALCIUM 9.0 07/05/2015   PROT 7.7 06/05/2015   ALBUMIN 3.7 06/05/2015   AST 15 06/05/2015   ALT 15 06/05/2015   ALKPHOS 81 06/05/2015   BILITOT 0.4 06/05/2015   GFRNONAA >60 07/05/2015   GFRAA >60 07/05/2015    Lab Results  Component Value Date   WBC 7.7 07/05/2015   NEUTROABS 8.6* 06/05/2015   HGB 13.5 07/05/2015   HCT 40.2 07/05/2015   MCV 92.7 07/05/2015   PLT 232 07/05/2015     STUDIES: Ct Head W Wo Contrast  07/04/2015  CLINICAL DATA:  Small cell lung carcinoma.  Headache and confusion EXAM: CT HEAD WITHOUT AND WITH CONTRAST TECHNIQUE: Contiguous axial images were obtained from the base of the skull through the vertex without and with intravenous contrast CONTRAST:  74m OMNIPAQUE IOHEXOL 300 MG/ML  SOLN COMPARISON:  CT head 09/08/2014 FINDINGS: Dramatic interval change since prior study. Extensive enhancing mass lesions are now present. No acute hemorrhage. Enhancing mass lesion in the right body the corpus callosum extending into the right frontal lobe measures 4 cm. Moderate surrounding white matter edema. Necrotic cystic mass in the right parietal lobe with enhancing wall measures 4.4 cm. Extensive enhancing tissue surrounding the lateral ventricles bilaterally extending into the ventricle. This enhancing tissue extends into the third ventricle and aqueduct. There is enhancing mass lesion involving the inferior fourth ventricle or inferior cerebellar vermis measuring 2.7 cm in diameter. The enhancing areas show homogeneous  hyperdensity prior to contrast and relatively intense enhancement aside from some necrosis in the right frontal and right parietal lesions. Ventricle slightly dilated.  7 mm midline shift to the left. No skull lesions identified. IMPRESSION: Extensive enhancing tumor is seen. Given history of small cell lung cancer, this is most likely due to metastatic disease. This unusual pattern could also be seen with intracranial lymphoma. Early hydrocephalus. No acute hemorrhage. 7 mm midline shift to the left. These results were called by telephone at the time of interpretation on 07/04/2015 at 1:59 pm to Dr. MDelman Kitten, who verbally acknowledged these results. Electronically Signed   By: CFranchot GalloM.D.   On: 07/04/2015 14:00   Mr BJeri CosWIPContrast  07/05/2015  CLINICAL DATA:  45year old female with small cell lung cancer. Headache. Lightheadedness and  dizziness. Vomiting. Abnormal CT. Subsequent encounter. EXAM: MRI HEAD WITHOUT AND WITH CONTRAST TECHNIQUE: Multiplanar, multiecho pulse sequences of the brain and surrounding structures were obtained without and with intravenous contrast. CONTRAST:  46m MULTIHANCE GADOBENATE DIMEGLUMINE 529 MG/ML IV SOLN COMPARISON:  07/04/2015 and 09/08/2014 head CT. FINDINGS: Exam is motion degraded. Findings consistent with significant intracranial metastatic disease including: 4.5 x 4.2 x 4 cm necrotic irregular peripheral enhancing mass at the junction of the right temporal -parietal and frontal lobe with surrounding vasogenic edema compressing the atrium of the right lateral ventricle. Irregular necrotic tumor spanning over 6.2 x 5.8 x 4.8 cm extends from the right frontal lobe/ right caudate through the genu of the corpus callosum with irregular tumor along the periphery of the frontal horns of the lateral ventricles greater on left with subependymal/ interventricular spread of tumor into the third ventricle/midbrain and superior aspect of the aqueduct. Tumor extension into  the anterior aspect of the third ventral also involves hypothalamus and optic chiasm. Vasogenic edema most notable surrounding the right frontal component with mass effect upon the right lateral ventricle with 1.3 mm midline shift to the left. Mild hydrocephalus with dilated temporal horns. 3 x 2.5 x 2.6 cm enhancing tumor fourth ventricle/ vermis with narrowing of the fourth ventricle. Beside small amount hemorrhage associated with the right frontal lobe necrotic tumor, no intracranial hemorrhage noted. Regions of restricted motion surround portions of the tumor without thrombotic infarct noted. Possible early metastatic lesion within the base of the dens. Cervical medullary junction new within normal limits. No primary orbital region abnormality noted. Major intracranial vascular structures appear to be patent. IMPRESSION: Exam is motion degraded. Findings consistent with significant intracranial metastatic disease including: 4.5 x 4.2 x 4 cm necrotic irregular peripheral enhancing mass at the junction of the right temporal -parietal and frontal lobe with surrounding vasogenic edema compressing the atrium of the right lateral ventricle. Irregular necrotic tumor spanning over 6.2 x 5.8 x 4.8 cm extends from the right frontal lobe/ right caudate through the genu of the corpus callosum with irregular tumor along the periphery of the frontal horns of the lateral ventricles greater on left with subependymal/ interventricular spread of tumor into the third ventricle/midbrain and superior aspect of the aqueduct. Tumor extension into the anterior aspect of the third ventral also involves hypothalamus and optic chiasm. Vasogenic edema most notable surrounding the right frontal component with mass effect upon the right lateral ventricle with 1.3 mm midline shift to the left. Mild hydrocephalus with dilated temporal horns. 3 x 2.5 x 2.6 cm enhancing tumor fourth ventricle/ vermis with narrowing of the fourth ventricle.  Possible early metastatic lesion within the base of the dens. Electronically Signed   By: SGenia DelM.D.   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  PLAN:    1. Small cell carcinoma: Patient last seen in oncology clinic on May 12, 2015. At this point there was suspicion of recurrence of her malignancy. Since that time, patient has missed multiple scheduled scans as well as multiple scheduled appointments. MRI results reviewed independently and reported as above confirming stage IV disease with metastasis to her brain. Patient has been initiated on Decadron. Radiation oncology consult has been requested although patient has not yet been evaluated. Patient will require a PET scan upon discharge to assess for further systemic disease. Patient will likely require salvage chemotherapy, but given her performance status will wait until the conclusion of  her brain radiation to discuss further treatment options. Palliative care has also been consulted. Hospice, end-of-life, CODE STATUS was discussed with patient's family and they are not ready to make these decisions at this point. 2. Pelvic mass: Ovarian mass is cystic in nature and has increased significantly in size over the past 6 months. Case discussed with gynecology oncology who suspects this is a benign cystic mass.  Surgical intervention at this time given her metastatic small cell cancer is not warranted. Continue to monitor.  Appreciate consult, will follow.   Small cell carcinoma of lung [facility])   Staging form: Lung, AJCC 7th Edition     Clinical stage from 10/17/2014: T4, N3, M1 - Signed by Lloyd Huger, MD on 10/17/2014   Lloyd Huger, MD   07/05/2015 8:35 PM

## 2015-07-05 NOTE — Care Management (Signed)
Admitted to [facility] with the diagnosis of brain metastases. Primary site is lungs.  Lives with son and her mother, Florestine Avers [phone]). Dr. Einar Pheasant is primary care physician. Scheduled to be seen at the [facility] yesterday. Oncology consult ordered. No chemotherapy at present. No falls. Uses no aids for ambulation.  Physical therapy evaluation completed. Recommending Home Health/Phyical therapy and rolling walker. Ms. Idrovo sleeping. Will discuss services later today. Shelbie Ammons RN MSN CCM Care Management [phone]

## 2015-07-05 NOTE — Plan of Care (Signed)
Problem: Safety: Goal: Ability to remain free from injury will improve Remains free from injury  Problem: Health Behavior/Discharge Planning: Goal: Ability to manage health-related needs will improve Outcome: Not Progressing Some periods of confusion.  Alert  This pm. Ativan  This am for mri due to pt inability to tol closed  In spaces. Pt slept after that but  Awake  This pm.  Iv meds cont. tol po meds ok.

## 2015-07-05 NOTE — Progress Notes (Signed)
[place] [facility] [place] NAME: Regene Mccarthy    MR#:  [id number]  DATE OF BIRTH:  Nov 20, 1970  SUBJECTIVE:   Sedated from ativan given earlier for MRI REVIEW OF SYSTEMS:   Review of Systems  Unable to perform ROS: acuity of condition  Constitutional: Negative for fever, chills and weight loss.  HENT: Negative for ear discharge, ear pain and nosebleeds.   Eyes: Negative for blurred vision, pain and discharge.  Respiratory: Negative for sputum production, shortness of breath, wheezing and stridor.   Cardiovascular: Negative for chest pain, palpitations, orthopnea and PND.  Gastrointestinal: Negative for nausea, vomiting, abdominal pain and diarrhea.  Genitourinary: Negative for urgency and frequency.  Musculoskeletal: Negative for back pain and joint pain.  Neurological: Negative for sensory change, speech change, focal weakness and weakness.  Psychiatric/Behavioral: Negative for depression and hallucinations. The patient is not nervous/anxious.    Tolerating Diet:yes Tolerating PT: home health PT  DRUG ALLERGIES:   Allergies  Allergen Reactions  . Codeine Hives    VITALS:  Blood pressure 140/68, pulse 69, temperature 98.6 F (37 C), temperature source Oral, resp. rate 18, height '5\' 2"'$  (1.575 m), weight 93.441 kg (206 lb), last menstrual period 06/03/2015, SpO2 94 %.  PHYSICAL EXAMINATION:   Physical Exam  GENERAL:  45 y.o.-year-old patient lying in the bed with no acute distress.  EYES: Pupils equal, round, reactive to light and accommodation. No scleral icterus. Extraocular muscles intact.  HEENT: Head atraumatic, normocephalic. Oropharynx and nasopharynx clear.  NECK:  Supple, no jugular venous distention. No thyroid enlargement, no tenderness.  LUNGS: Normal breath sounds bilaterally, no wheezing, rales, rhonchi. No use of accessory muscles of respiration.  CARDIOVASCULAR: S1, S2 normal. No murmurs, rubs, or gallops.   ABDOMEN: Soft, nontender, nondistended. Bowel sounds present. No organomegaly or mass.  EXTREMITIES: No cyanosis, clubbing or edema b/l.    NEUROLOGIC: sedated PSYCHIATRIC:  patient is sedated SKIN: No obvious rash, lesion, or ulcer.   LABORATORY PANEL:  CBC  Recent Labs Lab 07/05/15 0517  WBC 7.7  HGB 13.5  HCT 40.2  PLT 232    Chemistries   Recent Labs Lab 07/05/15 0517  NA 138  K 4.2  CL 101  CO2 30  GLUCOSE 117*  BUN 22*  CREATININE 0.92  CALCIUM 9.0    Cardiac Enzymes No results for input(s): TROPONINI in the last 168 hours.  RADIOLOGY:  Ct Head W Wo Contrast  07/04/2015  CLINICAL DATA:  Small cell lung carcinoma.  Headache and confusion EXAM: CT HEAD WITHOUT AND WITH CONTRAST TECHNIQUE: Contiguous axial images were obtained from the base of the skull through the vertex without and with intravenous contrast CONTRAST:  63m OMNIPAQUE IOHEXOL 300 MG/ML  SOLN COMPARISON:  CT head 09/08/2014 FINDINGS: Dramatic interval change since prior study. Extensive enhancing mass lesions are now present. No acute hemorrhage. Enhancing mass lesion in the right body the corpus callosum extending into the right frontal lobe measures 4 cm. Moderate surrounding white matter edema. Necrotic cystic mass in the right parietal lobe with enhancing wall measures 4.4 cm. Extensive enhancing tissue surrounding the lateral ventricles bilaterally extending into the ventricle. This enhancing tissue extends into the third ventricle and aqueduct. There is enhancing mass lesion involving the inferior fourth ventricle or inferior cerebellar vermis measuring 2.7 cm in diameter. The enhancing areas show homogeneous hyperdensity prior to contrast and relatively intense enhancement aside from some necrosis in the right frontal and right parietal lesions. Ventricle  slightly dilated.  7 mm midline shift to the left. No skull lesions identified. IMPRESSION: Extensive enhancing tumor is seen. Given history of small  cell lung cancer, this is most likely due to metastatic disease. This unusual pattern could also be seen with intracranial lymphoma. Early hydrocephalus. No acute hemorrhage. 7 mm midline shift to the left. These results were called by telephone at the time of interpretation on 07/04/2015 at 1:59 pm to Dr. Delman Kitten , who verbally acknowledged these results. Electronically Signed   By: Franchot Gallo M.D.   On: 07/04/2015 14:00   Mr Jeri Cos WP Contrast  07/05/2015  CLINICAL DATA:  45 year old female with small cell lung cancer. Headache. Lightheadedness and dizziness. Vomiting. Abnormal CT. Subsequent encounter. EXAM: MRI HEAD WITHOUT AND WITH CONTRAST TECHNIQUE: Multiplanar, multiecho pulse sequences of the brain and surrounding structures were obtained without and with intravenous contrast. CONTRAST:  41m MULTIHANCE GADOBENATE DIMEGLUMINE 529 MG/ML IV SOLN COMPARISON:  07/04/2015 and 09/08/2014 head CT. FINDINGS: Exam is motion degraded. Findings consistent with significant intracranial metastatic disease including: 4.5 x 4.2 x 4 cm necrotic irregular peripheral enhancing mass at the junction of the right temporal -parietal and frontal lobe with surrounding vasogenic edema compressing the atrium of the right lateral ventricle. Irregular necrotic tumor spanning over 6.2 x 5.8 x 4.8 cm extends from the right frontal lobe/ right caudate through the genu of the corpus callosum with irregular tumor along the periphery of the frontal horns of the lateral ventricles greater on left with subependymal/ interventricular spread of tumor into the third ventricle/midbrain and superior aspect of the aqueduct. Tumor extension into the anterior aspect of the third ventral also involves hypothalamus and optic chiasm. Vasogenic edema most notable surrounding the right frontal component with mass effect upon the right lateral ventricle with 1.3 mm midline shift to the left. Mild hydrocephalus with dilated temporal horns. 3 x 2.5  x 2.6 cm enhancing tumor fourth ventricle/ vermis with narrowing of the fourth ventricle. Beside small amount hemorrhage associated with the right frontal lobe necrotic tumor, no intracranial hemorrhage noted. Regions of restricted motion surround portions of the tumor without thrombotic infarct noted. Possible early metastatic lesion within the base of the dens. Cervical medullary junction new within normal limits. No primary orbital region abnormality noted. Major intracranial vascular structures appear to be patent. IMPRESSION: Exam is motion degraded. Findings consistent with significant intracranial metastatic disease including: 4.5 x 4.2 x 4 cm necrotic irregular peripheral enhancing mass at the junction of the right temporal -parietal and frontal lobe with surrounding vasogenic edema compressing the atrium of the right lateral ventricle. Irregular necrotic tumor spanning over 6.2 x 5.8 x 4.8 cm extends from the right frontal lobe/ right caudate through the genu of the corpus callosum with irregular tumor along the periphery of the frontal horns of the lateral ventricles greater on left with subependymal/ interventricular spread of tumor into the third ventricle/midbrain and superior aspect of the aqueduct. Tumor extension into the anterior aspect of the third ventral also involves hypothalamus and optic chiasm. Vasogenic edema most notable surrounding the right frontal component with mass effect upon the right lateral ventricle with 1.3 mm midline shift to the left. Mild hydrocephalus with dilated temporal horns. 3 x 2.5 x 2.6 cm enhancing tumor fourth ventricle/ vermis with narrowing of the fourth ventricle. Possible early metastatic lesion within the base of the dens. Electronically Signed   By: SGenia DelM.D.   On: 07/05/2015 11:54   ASSESSMENT AND PLAN:  Solae Norling is a 45 y.o. female with a known history of lung cancer. She presents with headache going on for weeks. She is also lightheaded  dizzy and off balance with walking. She's been vomiting on and off for the last few weeks. She's also had some confusion and possible hallucinations  1. 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 Patient has midline shift and multiple lesions on CT scan. Overall prognosis is poor. Patient is a full code. Decadron 10 mg 1  -Empiric Keppra to prevent seizure. Oncology and radiation oncology consultations. - MRI of the brain shows multiple necrotic metastatic lesions  2. Nausea vomiting- secondary to brain metastases. When necessary nausea medications  3. Hypokalemia replace potassium in IV fluids  4. Unsteady gait-  physical therapy recommends HHPT  5. Tobacco abuse- smoking cessation counseling done 3 minutes by me and nicotine patch ordered  6. Impaired fasting glucose- put on sliding scale while on steroids  7. Confusion and possible hallucinations- likely with brain metastases.  8. Hypothyroidism unspecified continue levothyroxine   Pt has overall poor prognsosis Case discussed with Care Management/Social Worker. Management plans discussed with the patient, family and they are in agreement.  CODE STATUS: *full DVT Prophylaxis: lovenox  TOTAL TIME TAKING CARE OF THIS PATIENT: 30 minutes.  >50% time spent on counselling and coordination of care  POSSIBLE D/C IN 1-2 DAYS, DEPENDING ON CLINICAL CONDITION.  Note: This dictation was prepared with Dragon dictation along with smaller phrase technology. Any transcriptional errors that result from this process are unintentional.  Kaylia Winborne M.D on 07/05/2015 at 2:02 PM  Between 7am to 6pm - Pager - [phone]  After 6pm go to [email or web address] - password [email or web address] [facility]  Office  [phone]  CC: Primary care physician; Einar Pheasant, MD

## 2015-07-06 ENCOUNTER — Ambulatory Visit
Admit: 2015-07-06 | Discharge: 2015-07-06 | Disposition: A | Discharge status: Home / Self Care | Payer: Medicaid Other | Attending: Radiation Oncology | Admitting: Radiation Oncology

## 2015-07-06 ENCOUNTER — Encounter: Payer: Self-pay | Admitting: Radiation Oncology

## 2015-07-06 DIAGNOSIS — R2689 Other abnormalities of gait and mobility: Secondary | ICD-10-CM

## 2015-07-06 DIAGNOSIS — R112 Nausea with vomiting, unspecified: Secondary | ICD-10-CM

## 2015-07-06 DIAGNOSIS — N39498 Other specified urinary incontinence: Secondary | ICD-10-CM

## 2015-07-06 DIAGNOSIS — G9341 Metabolic encephalopathy: Secondary | ICD-10-CM

## 2015-07-06 DIAGNOSIS — R443 Hallucinations, unspecified: Secondary | ICD-10-CM

## 2015-07-06 DIAGNOSIS — G936 Cerebral edema: Secondary | ICD-10-CM

## 2015-07-06 LAB — GLUCOSE, CAPILLARY
GLUCOSE-CAPILLARY: 117 mg/dL — AB (ref 65–99)
GLUCOSE-CAPILLARY: 132 mg/dL — AB (ref 65–99)
Glucose-Capillary: 131 mg/dL — ABNORMAL HIGH (ref 65–99)
Glucose-Capillary: 160 mg/dL — ABNORMAL HIGH (ref 65–99)

## 2015-07-06 MED ORDER — LEVETIRACETAM 500 MG PO TABS: 500.0000 mg | ORAL_TABLET | Freq: Two times a day (BID) | ORAL | Status: AC

## 2015-07-06 MED ORDER — DEXAMETHASONE 2 MG PO TABS: 10.0000 mg | ORAL_TABLET | Freq: Four times a day (QID) | ORAL | Status: AC

## 2015-07-06 MED ORDER — DEXAMETHASONE 4 MG PO TABS
10.0000 mg | ORAL_TABLET | Freq: Four times a day (QID) | ORAL | Status: DC
  Administered 2015-07-06 – 2015-07-07 (×5): 10 mg via ORAL
  Filled 2015-07-06: qty 2.5
  Filled 2015-07-06 (×2): qty 3
  Filled 2015-07-06: qty 2.5
  Filled 2015-07-06: qty 3
  Filled 2015-07-06 (×2): qty 2.5

## 2015-07-06 NOTE — Plan of Care (Signed)
VSS, afebrile. Plan to have radiation simulation today w/ Dr. Donella Stade at [facility].  Problem: Safety: Goal: Ability to remain free from injury will improve Outcome: Progressing High fall risk. Bed alarm on, hourly rounding. Safe environment provided. Pt son at bedside, understands how to use call system for assistance for his mother.  Problem: Pain Managment: Goal: General experience of comfort will improve Outcome: Progressing C/o headache pain relieved by PRN Tylenol  Problem: Skin Integrity: Goal: Risk for impaired skin integrity will decrease Outcome: Progressing Moisture associated skin damage under breasts & groin. Cleanser & powder applied. Peri care completed.   Problem: Activity: Goal: Risk for activity intolerance will decrease Outcome: Progressing Pt very weak, requiring moderate assistance w/ movement in bed. Rest periods provided.

## 2015-07-06 NOTE — Care Management (Signed)
Discharge to home today per Dr. Posey Pronto. Rolling walker obtained thru [place] [facility]. Will be followed by [facility] in the home for nursing/physical therapy. Tammy Andersen is in agreement with this plan Son will transport. Shelbie Ammons RN MSN CCM Care Management [phone]

## 2015-07-06 NOTE — Plan of Care (Signed)
Problem: Education: Goal: Knowledge of [facility] Education information/materials will improve Outcome: Not Progressing Periods of confusion  Problem: Safety: Goal: Ability to remain free from injury will improve Outcome: Progressing No injury this shift  Problem: Pain Managment: Goal: General experience of comfort will improve Outcome: Progressing Denies pain

## 2015-07-06 NOTE — Discharge Summary (Addendum)
[place] [facility] [place] NAME: Tammy Andersen    MR#:  [id number]  DATE OF BIRTH:  1971/04/25  DATE OF ADMISSION:  07/04/2015 ADMITTING PHYSICIAN: Loletha Grayer, MD  DATE OF DISCHARGE: 07/07/15  PRIMARY CARE PHYSICIAN: No primary care provider on file.    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 Lung cancer ([place]) 08/08/2014    chemo and rad tx   . Hypothyroidism     HOSPITAL COURSE:   [name] is a 45 y.o. female with a known history of lung cancer. She presents with headache going on for weeks. She is also lightheaded dizzy and off balance with walking. She's been vomiting on and off for the last few weeks. She's also had some confusion and possible hallucinations  1. 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 Patient has midline shift and multiple lesions on CT scan. Overall prognosis is poor. Patient is a full code. Decadron 10 mg po qid for cerebral edema -Empiric Keppra to prevent seizure. - Oncology and radiation oncology consultations appreciated. Pt to start brain radiation from monday jan 16th - MRI of the brain shows multiple necrotic metastatic lesions -pt advised to avoid taking tramadol since it decreases seizure threshold, advised to take prn tylenol and or ibuprofen  2. Nausea vomiting- secondary to brain metastases.prn nausea medications  3. Hypokalemia -repleted  4. Unsteady gait- physical therapy recommends [facility]  5. Tobacco abuse- smoking cessation counseling done 3 minutes by me and nicotine patch ordered  6. Confusion and possible hallucinations- likely with brain metastases.  7. Hypothyroidism  unspecified continue levothyroxine   Pt has overall poor prognosis  [facility] CONSULTS OBTAINED:  Treatment Team:  Loletha Grayer, MD Lloyd Huger, MD  DRUG ALLERGIES:   Allergies  Allergen Reactions  . Codeine Hives    DISCHARGE MEDICATIONS:   Current Discharge Medication List    START taking these medications   Details  dexamethasone (DECADRON) 2 MG tablet Take 5 tablets (10 mg total) by mouth every 6 (six) hours. Qty: 30 tablet, Refills: 1    levETIRAcetam (KEPPRA) 500 MG tablet Take 1 tablet (500 mg total) by mouth 2 (two) times daily. Qty: 60 tablet, Refills: 0      CONTINUE these medications which have NOT CHANGED   Details  levothyroxine (SYNTHROID, LEVOTHROID) 50 MCG tablet Take 50 mcg by mouth daily before breakfast.    ondansetron (ZOFRAN) 4 MG tablet Take 4 mg by mouth every 8 (eight) hours as needed for nausea or vomiting.    vitamin B-12 (CYANOCOBALAMIN) 1000 MCG tablet Take 1,000 mcg by mouth daily.      STOP taking these medications     traMADol (ULTRAM) 50 MG tablet         If you experience worsening of your admission symptoms, develop shortness of breath, life threatening emergency, suicidal or homicidal thoughts you must seek medical attention immediately by calling 911 or calling your MD immediately  if symptoms less severe.  You Must read complete instructions/literature along with all the possible adverse reactions/side effects for all the Medicines you take and that have been prescribed to you. Take any new Medicines after you have completely  understood and accept all the possible adverse reactions/side effects.   Please note  You were cared for by a hospitalist during your hospital stay. If you have any questions about your discharge medications or the care you received while you were in the hospital after you are discharged, you can call the unit and asked to speak with the hospitalist on call if the hospitalist that  took care of you is not available. Once you are discharged, your primary care physician will handle any further medical issues. Please note that NO REFILLS for any discharge medications will be authorized once you are discharged, as it is imperative that you return to your primary care physician (or establish a relationship with a primary care physician if you do not have one) for your aftercare needs so that they can reassess your need for medications and monitor your lab values. Today   SUBJECTIVE   weak  VITAL SIGNS:  Blood pressure 133/78, pulse 61, temperature 98.3 F (36.8 C), temperature source Oral, resp. rate 20, height '5\' 2"'$  (1.575 m), weight 93.441 kg (206 lb), last menstrual period 06/03/2015, SpO2 100 %.  I/O:  No intake or output data in the 24 hours ending 07/07/15 1131  PHYSICAL EXAMINATION:  GENERAL:  45 y.o.-year-old patient lying in the bed with no acute distress.  EYES: Pupils equal, round, reactive to light and accommodation. No scleral icterus. Extraocular muscles intact.  HEENT: Head atraumatic, normocephalic. Oropharynx and nasopharynx clear.  NECK:  Supple, no jugular venous distention. No thyroid enlargement, no tenderness.  LUNGS: Normal breath sounds bilaterally, no wheezing, rales,rhonchi or crepitation. No use of accessory muscles of respiration.  CARDIOVASCULAR: S1, S2 normal. No murmurs, rubs, or gallops.  ABDOMEN: Soft, non-tender, non-distended. Bowel sounds present. No organomegaly or mass.  EXTREMITIES: No pedal edema, cyanosis, or clubbing.  NEUROLOGIC: Cranial nerves II through XII are intact. Muscle strength 5/5 in all extremities. Sensation intact. Gait not checked.  PSYCHIATRIC: The patient is alert and oriented x 3.  SKIN: No obvious rash, lesion, or ulcer.   DATA REVIEW:   CBC   Recent Labs Lab 07/05/15 0517  WBC 7.7  HGB 13.5  HCT 40.2  PLT 232    Chemistries   Recent Labs Lab 07/07/15 0604  NA 137  K 4.3  CL 107  CO2 26   GLUCOSE 130*  BUN 28*  CREATININE 0.87  CALCIUM 8.9    Microbiology Results   No results found for this or any previous visit (from the past 240 hour(s)).  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 Headache. Lightheadedness and dizziness. Vomiting. Abnormal CT. Subsequent encounter. EXAM: MRI HEAD WITHOUT AND WITH CONTRAST TECHNIQUE: Multiplanar, multiecho pulse sequences of the brain and surrounding structures were obtained without and with intravenous contrast. CONTRAST:  58m MULTIHANCE GADOBENATE DIMEGLUMINE 529 MG/ML IV SOLN COMPARISON:  07/04/2015 and 09/08/2014 head CT. FINDINGS: Exam is motion degraded. Findings consistent with significant intracranial metastatic disease including: 4.5 x 4.2 x 4 cm necrotic irregular peripheral enhancing mass at the junction of the right temporal -parietal and frontal lobe with surrounding vasogenic edema compressing the atrium of the right lateral ventricle. Irregular necrotic tumor spanning over 6.2 x 5.8 x 4.8 cm extends from the right frontal lobe/ right caudate through the genu of the corpus callosum with irregular tumor along the periphery of the frontal horns of the lateral ventricles greater on left with subependymal/ interventricular spread of  tumor into the third ventricle/midbrain and superior aspect of the aqueduct. Tumor extension into the anterior aspect of the third ventral also involves hypothalamus and optic chiasm. Vasogenic edema most notable surrounding the right frontal component with mass effect upon the right lateral ventricle with 1.3 mm midline shift to the left. Mild hydrocephalus with dilated temporal horns. 3 x 2.5 x 2.6 cm enhancing tumor fourth ventricle/ vermis with narrowing of the fourth ventricle. Beside small amount hemorrhage associated with the right frontal lobe necrotic tumor, no intracranial hemorrhage noted. Regions of restricted motion surround  portions of the tumor without thrombotic infarct noted. Possible early metastatic lesion within the base of the dens. Cervical medullary junction new within normal limits. No primary orbital region abnormality noted. Major intracranial vascular structures appear to be patent. IMPRESSION: Exam is motion degraded. Findings consistent with significant intracranial metastatic disease including: 4.5 x 4.2 x 4 cm necrotic irregular peripheral enhancing mass at the junction of the right temporal -parietal and frontal lobe with surrounding vasogenic edema compressing the atrium of the right lateral ventricle. Irregular necrotic tumor spanning over 6.2 x 5.8 x 4.8 cm extends from the right frontal lobe/ right caudate through the genu of the corpus callosum with irregular tumor along the periphery of the frontal horns of the lateral ventricles greater on left with subependymal/ interventricular spread of tumor into the third ventricle/midbrain and superior aspect of the aqueduct. Tumor extension into the anterior aspect of the third ventral also involves hypothalamus and optic chiasm. Vasogenic edema most notable surrounding the right frontal component with mass effect upon the right lateral ventricle with 1.3 mm midline shift to the left. Mild hydrocephalus with dilated temporal horns. 3 x 2.5 x 2.6 cm enhancing tumor fourth ventricle/ vermis with narrowing of the fourth ventricle. Possible early metastatic lesion within the base of the dens. Electronically Signed   By: Genia Del M.D.   On: 07/05/2015 11:54   Management plans discussed with the patient and in agreement.  CODE STATUS:     Code Status Orders        Start     Ordered   07/04/15 1735  Full code   Continuous     07/04/15 1734    Code Status History    Date Active Date Inactive Code Status Order ID Comments User Context   This patient has a current code status but no historical code status.      TOTAL TIME TAKING CARE OF THIS PATIENT: 40  minutes.    Kimya Mccahill M.D on 07/07/2015 at 11:31 AM  Between 7am to 6pm - Pager - [phone] After 6pm go to [email or web address] - password [email or web address] [facility]  [place] [facility]  Office  [phone]  CC: Primary care physician; No primary care provider on file.

## 2015-07-06 NOTE — Consult Note (Addendum)
Palliative Medicine Inpatient Consult Note   Name: Tammy Andersen Date: 07/06/2015 MRN: [id number]  DOB: [birth year]  Referring Physician: Fritzi Mandes, MD  Palliative Care consult requested for this 45 y.o. female for goals of medical therapy in patient with small cell lung cancer now with metastatic disease with brain mets.  Dr Grayland Ormond, oncologist, personally asked me to attempt to convey how sick the pt is to pt and family, as there appears to be a reluctance to accept this information.    TODAY'S DISCUSSIONS AND DECISIONS: I went into room and found son bedside, but on his cellphone talking to someone. I came back and later, he was free to meet with me.   I talked with pt alone. She is groggy and also hallucinating or having visual illusions --related to lights just outside the room --where she thinks her son has gone. She is confused on some issues.  She did tell me 'its always good to try once' when I spoke to her about code status.  She did not say much beyond that though I tried to find an opportunity to connect with her on this subject.  She also said she didn't like Hospice 'because she knew some people who went to hospice and they didn't make it'.  She does not seem to have the capacity at this point to recognize what her condition is truly about.  She was not able to restate to me exactly what is wrong with her.  She lives at home with her 55 yr old mother and her son.  Only her mother's number is listed in demographics.   The son returned and we had a meeting away from pt in a family room.    The patient's son, Jeneen Rinks, basically seemed to have only a rudimentary understanding of what was going on.  He had the basics, but did not have an understanding of how seriously ill his mother is currently.  He recounted some misinformation he had gotten from other sources and I answered his questions.  I was able to draw pictures and I drew out [facility] MET in her brain showing the [place].  I  explained that there was some bleeding. I explained the edema.  He wanted to know how all this happened so fast and I then drew pictures demonstrating how one cancer cell might not be detected but it then turns into two cells which turns into 4 which turns into 8 etc.  He was able to grasp the concept of 'doubling time' and was appreciative of having these things explained to him.  I found him to be very open and caring and inquisitive and polite.    Once he seemed to grasp how seriously ill his mother is and that she is going to die of this malignancy, we talked about radiation and how that might buy her a few extra months of life.  He asked if it could make things worse and I had to mention nausea, vomiting, and weakness which can occur with some.    Then, we talked about Hospice vs [facility] and we talked about different locations and settings where his mother could get Hospice care.  I was attempting to help him see that Hospice in the Home might be a much better choice for them than [facility] (since pt now has right sided weakness, hallucinations, etc).    He was focused on how long helpers or nurses would stay in the home. He had not heard about [facility] as this communication was apparently made with pt only and not with the son. And pt is not communicating or thinking well.  So this was apparently 'news' to him.    I said, "When your mother goes home today with [facility] arranged to start next week sometime..." At this point, he was shocked and said, "NO WAY IS [place]".  He then stated that he had heard Dr Donella Stade say that she would stay until Monday when she gets radiation.  He then said, he could not push her up the stairs and take care of her hygiene needs etc etc. He had the idea that they would get help in the home to do all of this and he is only now realizing that going home is not the best plan since such services are not provided as he was thinking might be.  Also, he said, "My  mother cannot die in the home because then I will have a dead grandmother also!"  Bottom line:  --son refused discharge today as saying it was not possible or workable and not what he was told about  --discharge with [facility] is not a good plan for pt who is not a good rehab candidate in my opinion    --discharge home at all sounds like it is not something that should happen given what son has said  --pt could go to [facility] (she is appropriate) BUT she could not then also get the palliative radiation   --pt could go to a SNF for long term care and Hospice in the facility--she has Medicaid coverage (this would enable her to go get the cranial radiation while still receiving appropriate Hospice services.  I have talked with Dr. Posey Pronto several times and have been informed that Dr Donella Stade stated that pt needed to stay today for simulation and then she could return to his office on Monday.  Apparently, the son misinterpreted what was said and he believes he was told she would stay here at least through [facility] randiation session.    We will talk with all care team members and the son again tomorrow.  I do not feel pt is able to make her own health care decisions at this point in time. She should be informed, but she does not stay 'logical' during most of a conversation. She is still full code, but that should be discussed with son at another time.  Not sure if pts mother needs to be involved in decisions. Son seemed to be saying 'no' to that idea.    I do need a number for the son. It is not listed in demographics.   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 ---with brain mets--extensive ---with vasogenic edema due to brain mets and with 85m shift  ---with a small hemorrhagic bleed near one met ---primary is lung Metabolic Encephalopathy and Confusion due to brain edema Right sided weakness ---due to brain  shift due to edema from met Urinary incontinence due to brain mets Hypothyroidism Ongoing tobacco smoking Medical noncompliance Impaired fasting glucose  Hypokalemia --corrected Gait disorder due to brain mets and brain shift Nausea and vomiting due to brain mets  -----------------------------------------------------------------------------------------------------------------------------------  REVIEW OF SYSTEMS:  Unable to provide due to confusion due to brain mets with encephalopathy  SPIRITUAL SUPPORT SYSTEM: unclear.  SOCIAL HISTORY:  reports that she has been smoking.  She has never used smokeless tobacco. She reports that she does not drink alcohol  or use illicit drugs.  LEGAL DOCUMENTS:  none  CODE STATUS: Full code  PAST MEDICAL HISTORY: Past Medical History  Diagnosis Date  . Lung cancer ([place]) 08/08/2014    chemo and rad tx   . Hypothyroidism     PAST SURGICAL HISTORY:  Past Surgical History  Procedure Laterality Date  . Cholecystectomy    . Portacath placement      ALLERGIES:  is allergic to codeine.  MEDICATIONS:  Current Facility-Administered Medications  Medication Dose Route Frequency Provider Last Rate Last Dose  . acetaminophen (TYLENOL) tablet 650 mg  650 mg Oral Q6H PRN Loletha Grayer, MD   650 mg at 07/06/15 0114   Or  . acetaminophen (TYLENOL) suppository 650 mg  650 mg Rectal Q6H PRN Loletha Grayer, MD      . dexamethasone (DECADRON) tablet 10 mg  10 mg Oral 4 times per day Fritzi Mandes, MD   10 mg at 07/06/15 1227  . enoxaparin (LOVENOX) injection 40 mg  40 mg Subcutaneous Q24H Darylene Price [place], RPH   40 mg at 07/05/15 1939  . insulin aspart (novoLOG) injection 0-5 Units  0-5 Units Subcutaneous QHS Loletha Grayer, MD   0 Units at 07/04/15 2214  . insulin aspart (novoLOG) injection 0-9 Units  0-9 Units Subcutaneous TID WC Loletha Grayer, MD   1 Units at 07/06/15 1228  . levETIRAcetam (KEPPRA) tablet 500 mg  500 mg Oral BID Loletha Grayer, MD    500 mg at 07/06/15 0806  . levothyroxine (SYNTHROID, LEVOTHROID) tablet 112 mcg  112 mcg Oral QAC breakfast Fritzi Mandes, MD   112 mcg at 07/06/15 0806  . nicotine (NICODERM CQ - dosed in mg/24 hr) patch 7 mg  7 mg Transdermal Daily Loletha Grayer, MD   7 mg at 07/06/15 0806  . ondansetron (ZOFRAN) tablet 4 mg  4 mg Oral Q6H PRN Loletha Grayer, MD       Or  . ondansetron [facility]) injection 4 mg  4 mg Intravenous Q6H PRN Loletha Grayer, MD      . senna-docusate (Senokot-S) tablet 1 tablet  1 tablet Oral BID Lenis Noon, [facility]   1 tablet at 07/06/15 1607  . vitamin B-12 (CYANOCOBALAMIN) tablet 1,000 mcg  1,000 mcg Oral Daily Loletha Grayer, MD   1,000 mcg at 07/06/15 0806    Vital Signs: BP 119/73 mmHg  Pulse 59  Temp(Src) 97.8 F (36.6 C) (Oral)  Resp 20  Ht 5' 2"  (1.575 m)  Wt 93.441 kg (206 lb)  BMI 37.67 kg/m2  SpO2 97%  LMP 06/03/2015 Filed Weights   07/04/15 1023 07/04/15 1950  Weight: 96.163 kg (212 lb) 93.441 kg (206 lb)    Estimated body mass index is 37.67 kg/(m^2) as calculated from the following:   Height as of this encounter: 5' 2"  (1.575 m).   Weight as of this encounter: 93.441 kg (206 lb).  PERFORMANCE STATUS (ECOG) : 4 - Bedbound  PHYSICAL EXAM: Lethargic She is not wearing a gown --gown is on the floor Son is bedside and on the phone. Pt is barely able to talk but can talk and answers some questions She did not really focus or follow She is mildy weak on left side and cannot fully grip or press down with right hand or foot. She has pale skin with some lesions (?furuncles on chest and arms) Hrt rrr no m Lungs cta ant Abd obese soft and NT Ext no cyanosis or mottling     LABS: CBC:  Component Value Date/Time   WBC 7.7 07/05/2015 0517   WBC 6.9 10/13/2014 0942   HGB 13.5 07/05/2015 0517   HGB 13.6 10/13/2014 0942   HCT 40.2 07/05/2015 0517   HCT 40.2 10/13/2014 0942   PLT 232 07/05/2015 0517   PLT 272 10/13/2014 0942   MCV 92.7 07/05/2015  0517   MCV 91 10/13/2014 0942   NEUTROABS 8.6* 06/05/2015 0631   NEUTROABS 4.6 10/13/2014 0942   LYMPHSABS 1.3 06/05/2015 0631   LYMPHSABS 2.1 10/13/2014 0942   MONOABS 0.4 06/05/2015 0631   MONOABS 0.1* 10/13/2014 0942   EOSABS 0.1 06/05/2015 0631   EOSABS 0.0 10/13/2014 0942   BASOSABS 0.1 06/05/2015 0631   BASOSABS 0.1 10/13/2014 0942   Comprehensive Metabolic Panel:    Component Value Date/Time   NA 138 07/05/2015 0517   NA 135 10/06/2014 0952   K 4.2 07/05/2015 0517   K 3.8 10/06/2014 0952   CL 101 07/05/2015 0517   CL 103 10/06/2014 0952   CO2 30 07/05/2015 0517   CO2 27 10/06/2014 0952   BUN 22* 07/05/2015 0517   BUN 7 10/06/2014 0952   CREATININE 0.92 07/05/2015 0517   CREATININE 0.59 10/06/2014 0952   GLUCOSE 117* 07/05/2015 0517   GLUCOSE 90 10/06/2014 0952   CALCIUM 9.0 07/05/2015 0517   CALCIUM 8.5* 10/06/2014 0952   AST 15 06/05/2015 0631   AST 16 10/06/2014 0952   ALT 15 06/05/2015 0631   ALT 13* 10/06/2014 0952   ALKPHOS 81 06/05/2015 0631   ALKPHOS 92 10/06/2014 0952   BILITOT 0.4 06/05/2015 0631   BILITOT 0.4 10/06/2014 0952   PROT 7.7 06/05/2015 0631   PROT 7.2 10/06/2014 0952   ALBUMIN 3.7 06/05/2015 0631   ALBUMIN 3.5 10/06/2014 0952   TESTS:  MRI brain 07/05/15: Findings consistent with significant intracranial metastatic disease including:  4.5 x 4.2 x 4 cm necrotic irregular peripheral enhancing mass at the junction of the right temporal -parietal and frontal lobe with surrounding vasogenic edema compressing the atrium of the right lateral ventricle.  Irregular necrotic tumor spanning over 6.2 x 5.8 x 4.8 cm extends from the right frontal lobe/ right caudate through the genu of the corpus callosum with irregular tumor along the periphery of the frontal horns of the lateral ventricles greater on left with subependymal/ interventricular spread of tumor into the third ventricle/midbrain and superior aspect of the aqueduct.  Tumor extension into the anterior aspect of the third ventral also involves hypothalamus and optic chiasm. Vasogenic edema most notable surrounding the right frontal component with mass effect upon the right lateral ventricle with 1.3 mm midline shift to the left. Mild hydrocephalus with dilated temporal horns.  3 x 2.5 x 2.6 cm enhancing tumor fourth ventricle/ vermis with narrowing of the fourth ventricle.  Possible early metastatic lesion within the base of the dens.   CT head 07/04/15: Extensive enhancing tumor is seen. Given history of small cell lung cancer, this is most likely due to metastatic disease. This unusual pattern could also be seen with intracranial lymphoma.  Early hydrocephalus. No acute hemorrhage. 7 mm midline shift to the left.  CT abd pelvis Nov 2016: IMPRESSION: 1. Increasing adenopathy in the mediastinum compatible with malignancy. Stable abnormally enlarged gastrohepatic lymph node. 2. Previous 5.4 cm left adnexal cystic lesion has significantly enlarged, currently measuring 14.8 by 10.6 by 12.7 cm, with internal septation noted. Cystic ovarian malignancy is not excluded. Pelvic sonography recommended. 3. Other imaging findings of potential clinical significance:  Left upper lobe scarring similar to prior. Mosaic attenuation similar to prior, possibly from small airways disease or chronic pulmonary embolus. 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 Right foraminal stenosis at L4-5 due to right foraminal disc protrusion. Stable right paracentral ventral hernia. Aortoiliac atherosclerotic vascular disease. Left anterior descending coronary artery atherosclerosis.    More than 50% of the visit was spent in counseling/coordination of care: Yes  Time Spent:  120 minutes

## 2015-07-06 NOTE — Clinical Documentation Improvement (Signed)
Internal Medicine Oncology  Based on the significant clinical findings below, please document any associated diagnoses/conditions the patient has or may have.   Cerebral/vasogenic edema  Hydrocephalus  Cerebral hemorrhage frontal lobe small  Other  Clinically Undetermined  Supporting Information: -- MRI brain 1/10:"Vasogenic edema most notable surrounding the right frontal component"  -- MRI brain 1/10: "Mild hydrocephalus with &dilated temporal horns" -- MRI brain 1/10:"small amount hemorrhage associated with the right frontal lobe necrotic tumor"  Treatment -- Treatment: Decadron, PET scan, Oncology for poss radiation and chemo  Please exercise your independent, professional judgment when responding. A specific answer is not anticipated or expected.  Thank You, Ezekiel Ina RN [place] [phone]

## 2015-07-07 ENCOUNTER — Encounter: Payer: Self-pay | Admitting: Oncology

## 2015-07-07 ENCOUNTER — Inpatient Hospital Stay: Payer: Medicaid Other

## 2015-07-07 ENCOUNTER — Inpatient Hospital Stay: Payer: Medicaid Other | Admitting: Oncology

## 2015-07-07 LAB — GLUCOSE, CAPILLARY
Glucose-Capillary: 126 mg/dL — ABNORMAL HIGH (ref 65–99)
Glucose-Capillary: 131 mg/dL — ABNORMAL HIGH (ref 65–99)
Glucose-Capillary: 112 mg/dL — ABNORMAL HIGH (ref 65–99)

## 2015-07-07 LAB — BASIC METABOLIC PANEL
ANION GAP: 4 — AB (ref 5–15)
BUN: 28 mg/dL — ABNORMAL HIGH (ref 6–20)
CO2: 26 mmol/L (ref 22–32)
Calcium: 8.9 mg/dL (ref 8.9–10.3)
Chloride: 107 mmol/L (ref 101–111)
Creatinine, Ser: 0.87 mg/dL (ref 0.44–1.00)
GFR calc non Af Amer: >60 mL/min (ref >60)
Glucose, Bld: 130 mg/dL — ABNORMAL HIGH (ref 65–99)
Potassium: 4.3 mmol/L (ref 3.5–5.1)
Sodium: 137 mmol/L (ref 135–145)

## 2015-07-07 NOTE — Progress Notes (Signed)
Pt discharged home at this time.  Home via car  Per son.  Discharge instructions discussed with son.  meds discussed. Diet/ activity and f/u discussed. Verbalized understanding.  Sl d/cd earlier.

## 2015-07-07 NOTE — Progress Notes (Signed)
New referral for [facility] services in the home following discharge. 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 Following another conversation with Palliative Care physician Dr. Megan Salon her son Tammy Andersen has chosen to take his mother home with hospice services. [name] is a 45 year old woman with a known history of lung cancer now presenting with brain metastases. Plan is for her to start brain radiation therapy on Monday 1/16, her son is aware.  Writer met in the room with Tammy Andersen and a friend, patient remained asleep through out he visit. She was awake but very confused at writer's earlier visit. Education was initiated regarding hospice services, philosophy and team approach to care   With understanding voiced after much reinforcement and discussion. Plan is for patient to return home via [facility] today, her son feels that he will be able to get her in his truck and into her home (up 5 steps) with the help of a friend who is coming to the hospital. Tammy Andersen and his grandmother will be living in the home with the patient. He declined a hospital bed at this time. CMRN Hassan Rowan has made arrangements for the patient to have a wheel chair and BSC to take home with her today. All updated information faxed to referral intake. Hospice information and contact number left with james. Hospital care team aware of discharge plan. Patient remains a full code at this time. Thank you for the opportunity to be involved in the care of this patient and her family. Flo Shanks RN, BSN, [facility] [facility] [place], [facility] [phone] c

## 2015-07-07 NOTE — Progress Notes (Signed)
Palliative Medicine Inpatient Consult Follow Up Note   Name: Tammy Andersen Date: 07/07/2015 MRN: [id number]  DOB: 06-19-1971  Referring Physician: Fritzi Mandes, MD  Palliative Care consult requested for this 45 y.o. 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  TODAY'S DISCUSSIONS AND DECISIONS: The decision has been made to send pt home with [facility].  I have met with pt and with pts mother who is in the room.  Mother is wondering how pt can get in the house with 5 steps. She is asking for a wheelchair. Says pt 'doesn't walk too good'. Mother says she 'doesn't want people in the home all the time' BUT she makes it clear that it is pt's son who is the Media planner. She would be Ok with whatever help they can get as long as there are not people in the house 'all the time'.  (Not an ideal request.)   Pt says (when asked again about hospice in the home) that "hospice turned me down" (this did not happen). --pt gets confused but can still carry on a conversation that has some accurate statement mixed in with the confused statements.  ---also says 'they are going to take all the pillows out of my home".  These kinds of statements come out of the blue and are no doubt related to her declining ability to be consistently based in reality (due to necrotic brain mets with edema and a shift).   Pt even agrees that she gets confused and that son is Media planner when she is confused (it comes and goes).  I have asked PT to see pt now -- before discharge.  Pt reportedly walked herself to the bathroom this am, so perhaps she can do more than appears to be the case.  She did ambulate some with PT on first eval earlier this week.  I have spoken with Hospice Liaison nurse who is attempting to set up [facility].  I will update Dr Grayland Ormond.  I would call the son, but no one has his cell number (not even his family). Son  works part time, so it may be hard to talk with him prior to discharge.    Son, Jeneen Rinks, does seem to now have a good understanding of how terminal his mother is--since I educated him during a long discussion yesterday.     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 ---with brain mets--extensive ---with vasogenic edema due to brain mets and with 25m shift  ---with a small hemorrhagic bleed near one met ---primary is lung Metabolic Encephalopathy and Confusion due to brain edema Right sided weakness ---due to brain shift due to edema from met Urinary incontinence due to brain mets Hypothyroidism Ongoing tobacco smoking Medical noncompliance Impaired fasting glucose  Hypokalemia --corrected Gait disorder due to brain mets and brain shift Nausea and vomiting due to brain mets    CODE STATUS: Full code  --discussed briefly with pt and son yesterday--warrants further discussion.    PAST MEDICAL HISTORY: Past Medical History  Diagnosis Date  . [place] cancer [place] 08/08[place]2014    chemo and rad tx   . Hypothyroidism     PAST SURGICAL HISTORY:  Past Surgical History  Procedure Laterality Date  . Cholecystectomy    . Portacath placement      Vital Signs: BP 133/78 mmHg  Pulse 61  Temp(Src) 98.3 F (36.8 C) (Oral)  Resp 20  Ht 5' 2"  (1.575 m)  Wt 93.441 kg (206 lb)  BMI 37.67 kg/m2  SpO2 100%  LMP 06/03/2015 Filed Weights   07/04/15 1023 07/04/15 1950  Weight: 96.163 kg (212 lb) 93.441 kg (206 lb)    Estimated body mass index is 37.67 kg/(m^2) as calculated from the following:   Height as of this encounter: 5' 2"  (1.575 m).   Weight as of this encounter: 93.441 kg (206 lb).  PHYSICAL EXAM: Lying in bed Makes confusing statements  Alert  EOMI   LABS: CBC:    Component Value Date/Time   WBC 7.7 07/05/2015 0517   WBC 6.9 10/13/2014 0942   HGB 13.5 07/05/2015 0517   HGB 13.6 10/13/2014 0942   HCT 40.2 07/05/2015 0517   HCT 40.2 10/13/2014 0942    PLT 232 07/05/2015 0517   PLT 272 10/13/2014 0942   MCV 92.7 07/05/2015 0517   MCV 91 10/13/2014 0942   NEUTROABS 8.6* 06/05/2015 0631   NEUTROABS 4.6 10/13/2014 0942   LYMPHSABS 1.3 06/05/2015 0631   LYMPHSABS 2.1 10/13/2014 0942   MONOABS 0.4 06/05/2015 0631   MONOABS 0.1* 10/13/2014 0942   EOSABS 0.1 06/05/2015 0631   EOSABS 0.0 10/13/2014 0942   BASOSABS 0.1 06/05/2015 0631   BASOSABS 0.1 10/13/2014 0942   Comprehensive Metabolic Panel:    Component Value Date/Time   NA 137 07/07/2015 0604   NA 135 10/06/2014 0952   K 4.3 07/07/2015 0604   K 3.8 10/06/2014 0952   CL 107 07/07/2015 0604   CL 103 10/06/2014 0952   CO2 26 07/07/2015 0604   CO2 27 10/06/2014 0952   BUN 28* 07/07/2015 0604   BUN 7 10/06/2014 0952   CREATININE 0.87 07/07/2015 0604   CREATININE 0.59 10/06/2014 0952   GLUCOSE 130* 07/07/2015 0604   GLUCOSE 90 10/06/2014 0952   CALCIUM 8.9 07/07/2015 0604   CALCIUM 8.5* 10/06/2014 0952   AST 15 06/05/2015 0631   AST 16 10/06/2014 0952   ALT 15 06/05/2015 0631   ALT 13* 10/06/2014 0952   ALKPHOS 81 06/05/2015 0631   ALKPHOS 92 10/06/2014 0952   BILITOT 0.4 06/05/2015 0631   BILITOT 0.4 10/06/2014 0952   PROT 7.7 06/05/2015 0631   PROT 7.2 10/06/2014 0952   ALBUMIN 3.7 06/05/2015 0631   ALBUMIN 3.5 10/06/2014 0952     Time Spent:  15 min

## 2015-07-07 NOTE — Progress Notes (Signed)
It has been challenging to consider discharge plan for patient. Her physical therapy and patient will need rehabilitation. However given her severe poor prognosis not sure if patient will be able to tolerate rehabilitation at the facility.  I am not sure how much patient's mother and son understands the prognosis patient has. Hospice home is appropriate however family not agreeable to make those issues and at the present. Came to see if I could discuss further with patient's son I wart he left. It has been very difficult to get in touch with son because he has not provided any contact numbers and not able to reach patient's mother on the phone as well. Spoke with patient's mother earlier this morning however she wants to discuss with patient's son about discharge plan. Patient's primary oncologist Dr. Grayland Ormond and radiation oncologist Dr. Donella Stade aware.

## 2015-07-07 NOTE — Care Management (Signed)
Spoke with Ms. Cronk and her mother, Florestine Avers [phone]). Agreeing to let [facility] come into the home and check on Ms. Awwad only. Ms. Rana Snare states she will be taking care of her daughter in the home. Will be arranging a bedside commode and rolling walker for home. Prescription for adult diapers given to Ms. Everette.  Dr. Donella Stade spoke with Ms. Amedeo Plenty and Ms Everette at the beside. Will need to come to [facility] [place] on Monday for radiation. Will arrange for Lucianne Lei to pick-up Ms. Amedeo Plenty and take her home Shelbie Ammons RN MSN [place] management [phone]

## 2015-07-07 NOTE — Progress Notes (Signed)
Palliative Care Update  I ran into pt's son, Jeneen Rinks, in the hallway.    He and I spoke again at some length.  He cannot give out the phone number of the cell phone he is using because he says it belongs to his girlfriend's daughter and he does not know the number.  He now feels that it would be best for his mother to have Hospice in the Home. He agrees that he is the one to make these decisions.  His mother (the patient) and his grandmother both deferred to him as decision maker earlier today.  Pt will need a wheelchair.  Son will try to get helpers to get pt up the stairs into their home (since pt cannot manage stairs and can barely walk with two people holding onto her with a rolling walker).  Gilford Rile is basically unsafe and pt is in need of the wheelchair for mobility. Cancer center will arrange a Lucianne Lei for transport for treatments.    Care Mgr and Hospice notified.     Colleen Can MD Palliative Care.  Time 45 minutes.  Counselling bedside.  See earlier note with exam, etc.

## 2015-07-07 NOTE — Progress Notes (Signed)
Informed by Care manager that pt's son is agreeable to take pt home with hospice services. Wheelchair ordered.

## 2015-07-07 NOTE — Plan of Care (Signed)
Problem: Education: Goal: Knowledge of [facility] Education information/materials will improve Outcome: Not Progressing Pt with encephalopathy.  Unable to reorient.  Family at bedside aware of how to contact RN .  Problem: Safety: Goal: Ability to remain free from injury will improve Outcome: Progressing Bed alarm in use.  Pt remains free from injury this shift.  Problem: Health Behavior/Discharge Planning: Goal: Ability to manage health-related needs will improve Outcome: Not Progressing Pt unable to manage health-related needs.  Family at bedside had long talk with Dr Megan Salon and are unable to provide pt with care she needs.  Possible need for SNF and hospice care.  Problem: Pain Managment: Goal: General experience of comfort will improve Outcome: Progressing Pt with no reports of pain or discomfort.  Problem: Skin Integrity: Goal: Risk for impaired skin integrity will decrease Outcome: Progressing Frequent rounds and changing incontinent brief when wet.  Barrier cream used to buttocks and perineum.  Problem: Activity: Goal: Risk for activity intolerance will decrease Outcome: Not Met (add Reason) Pt very unsteady and unable to stand without 2 assist.  Anticipate decrease in activity with progression of disease.

## 2015-07-07 NOTE — Consult Note (Signed)
Except an outstanding is perfect of Radiation Oncology NEW PATIENT EVALUATION  Name: Tammy Andersen  MRN: [id number]  Date:   07/04/2015     DOB: [birth year]   This 45 y.o. female patient presents to the clinic for initial evaluation of brain metastasis.  REFERRING PHYSICIAN: Einar Pheasant, MD  CHIEF COMPLAINT: No chief complaint on file.   DIAGNOSIS: There were no encounter diagnoses.   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 I saw no role back in August 2016 for any further palliative radiation therapy since she had an excellent response to systemic chemotherapy. She presented with increasing headaches some blurred vision and difficulty with balance. She had an MRI scan showing multiple lesions on CT scan and confirmed on MRI scan showing a 4.5 necrotic peripheral enhancing mass in the junction the right temporal parietal and frontal lobes with also 6 cm necrotic tumor in the right frontal lobe as well as 3 cm lesion in the fourth ventricle all consistent with metastatic disease to her brain. She is seen today for consultation and evaluation for palliative radiation therapy. She has improved somewhat on steroids is still somewhat confused having trouble ambulating and is performance status 3.  PLANNED TREATMENT REGIMEN: Whole brain radiation  PAST MEDICAL HISTORY:  has a past medical history of Lung cancer ([place]) (08/08/2014) and Hypothyroidism.    PAST SURGICAL HISTORY:  Past Surgical History  Procedure Laterality Date  . Cholecystectomy    . Portacath placement      FAMILY HISTORY: family history includes Heart disease in her father.  SOCIAL HISTORY:  reports that she has been smoking.  She has never used smokeless tobacco. She reports that she does not  drink alcohol or use illicit drugs.  ALLERGIES: Codeine  MEDICATIONS:  No current facility-administered medications for this visit.   Current Outpatient Prescriptions  Medication Sig Dispense Refill  . dexamethasone (DECADRON) 2 MG tablet Take 5 tablets (10 mg total) by mouth every 6 (six) hours. 30 tablet 1  . levETIRAcetam (KEPPRA) 500 MG tablet Take 1 tablet (500 mg total) by mouth 2 (two) times daily. 60 tablet 0   Facility-Administered Medications Ordered in Other Visits  Medication Dose Route Frequency Provider Last Rate Last Dose  . acetaminophen (TYLENOL) tablet 650 mg  650 mg Oral Q6H PRN Loletha Grayer, MD   650 mg at 07/06/15 0114   Or  . acetaminophen (TYLENOL) suppository 650 mg  650 mg Rectal Q6H PRN Loletha Grayer, MD      . dexamethasone (DECADRON) tablet 10 mg  10 mg Oral 4 times per day Fritzi Mandes, MD   10 mg at 07/07/15 1214  . enoxaparin (LOVENOX) injection 40 mg  40 mg Subcutaneous Q24H Lenis Noon, RPH   40 mg at 07/06/15 2218  . insulin aspart (novoLOG) injection 0-5 Units  0-5 Units Subcutaneous QHS Loletha Grayer, MD   0 Units at 07/04/15 2214  . insulin aspart (novoLOG) injection 0-9 Units  0-9 Units Subcutaneous TID WC Loletha Grayer, MD   1 Units at 07/07/15 1216  . levETIRAcetam (KEPPRA) tablet 500 mg  500 mg Oral BID Loletha Grayer, MD   500 mg at 07/07/15 1041  . levothyroxine (SYNTHROID, LEVOTHROID) tablet 112 mcg  112 mcg Oral QAC breakfast Fritzi Mandes, MD  112 mcg at 07/07/15 1041  . nicotine (NICODERM CQ - dosed in mg/24 hr) patch 7 mg  7 mg Transdermal Daily Loletha Grayer, MD   7 mg at 07/07/15 1050  . ondansetron (ZOFRAN) tablet 4 mg  4 mg Oral Q6H PRN Loletha Grayer, MD       Or  . ondansetron [facility]) injection 4 mg  4 mg Intravenous Q6H PRN Loletha Grayer, MD      . senna-docusate (Senokot-S) tablet 1 tablet  1 tablet Oral BID Lenis Noon, [employer]   1 tablet at 07/07/15 1041    ECOG PERFORMANCE STATUS:  3 - Symptomatic, >50% confined to  bed  REVIEW OF SYSTEMS: Except for the significant problems with mental status balance headaches have improved  Patient denies any weight loss, fatigue, weakness, fever, chills or night sweats. Patient denies any loss of vision, blurred vision. Patient denies any ringing  of the ears or hearing loss. No irregular heartbeat. Patient denies heart murmur or history of fainting. Patient denies any chest pain or pain radiating to her upper extremities. Patient denies any shortness of breath, difficulty breathing at night, cough or hemoptysis. Patient denies any swelling in the lower legs. Patient denies any nausea vomiting, vomiting of blood, or coffee ground material in the vomitus. Patient denies any stomach pain. Patient states has had normal bowel movements no significant constipation or diarrhea. Patient denies any dysuria, hematuria or significant nocturia. Patient denies any problems walking, swelling in the joints or loss of balance. Patient denies any skin changes, loss of hair or loss of weight. Patient denies any excessive worrying or anxiety or significant depression. Patient denies any problems with insomnia. Patient denies excessive thirst, polyuria, polydipsia. Patient denies any swollen glands, patient denies easy bruising or easy bleeding. Patient denies any recent infections, allergies or URI. Patient "s visual fields have not changed significantly in recent time.    PHYSICAL EXAM: LMP 06/03/2015 A well-developed obese female week wheelchair-bound in NAD crude visual fields are within normal range cranial nerves II through XII are grossly intact. Motor sensory and DTR levels are equal and symmetric in upper lower extremities. Well-developed well-nourished patient in NAD. HEENT reveals PERLA, EOMI, discs not visualized.  Oral cavity is clear. No oral mucosal lesions are identified. Neck is clear without evidence of cervical or supraclavicular adenopathy. Lungs are clear to A&P. Cardiac  examination is essentially unremarkable with regular rate and rhythm without murmur rub or thrill. Abdomen is benign with no organomegaly or masses noted. Motor sensory and DTR levels are equal and symmetric in the upper and lower extremities. Cranial nerves II through XII are grossly intact. Proprioception is intact. No peripheral adenopathy or edema is identified. No motor or sensory levels are noted. Crude visual fields are within normal range.  LABORATORY DATA: No pathology to review    RADIOLOGY RESULTS: CT scan of head and MRI scan of head are both reviewed compatible with the above-stated findings   IMPRESSION: Widespread brain metastasis in 45 year old female with known stage IV small cell lung cancer  PLAN: At this time like to start palliative radiation therapy to her whole brain. See no role for SRS in this case based on the widespread nature of her disease. Would plan on 3000 cGy in 10 fractions. I have set up and ordered CT simulation for today and will start her treatments first thing next week. She will continue on steroid therapy when discharged from the hospital. Risks and benefits of treatment including hair loss  fatigue alteration of blood counts possible cognitive decline all were discussed in detail with the patient and her family. They all seem to comprehend my treatment plan well.  I would like to take this opportunity for allowing me to participate in the care of your patient.Armstead Peaks., MD

## 2015-07-07 NOTE — Progress Notes (Addendum)
[facility] NAME: Tammy Andersen    MR#:  [id number]  DATE OF BIRTH:  Mar 27, 1971  SUBJECTIVE:   Doing well. Eating breakfast. Mother in the room REVIEW OF SYSTEMS:   Review of Systems  Constitutional: Negative for fever, chills and weight loss.  HENT: Negative for ear discharge, ear pain and nosebleeds.   Eyes: Negative for blurred vision, pain and discharge.  Respiratory: Negative for sputum production, shortness of breath, wheezing and stridor.   Cardiovascular: Negative for chest pain, palpitations, orthopnea and PND.  Gastrointestinal: Negative for nausea, vomiting, abdominal pain and diarrhea.  Genitourinary: Negative for urgency and frequency.  Musculoskeletal: Positive for myalgias. Negative for back pain and joint pain.  Neurological: Positive for weakness. Negative for sensory change, speech change and focal weakness.  Psychiatric/Behavioral: Negative for depression and hallucinations. The patient is not nervous/anxious.   All other systems reviewed and are negative.  Tolerating Diet:Yes Tolerating PT: HHPT  DRUG ALLERGIES:   Allergies  Allergen Reactions  . Codeine Hives    VITALS:  Blood pressure 133/78, pulse 61, temperature 98.3 F (36.8 C), temperature source Oral, resp. rate 20, height '5\' 2"'$  (1.575 m), weight 93.441 kg (206 lb), last menstrual period 06/03/2015, SpO2 100 %.  PHYSICAL EXAMINATION:   Physical Exam  GENERAL:  45 y.o.-year-old patient lying in the bed with no acute distress.  EYES: Pupils equal, round, reactive to light and accommodation. No scleral icterus. Extraocular muscles intact.  HEENT: Head atraumatic, normocephalic. Oropharynx and nasopharynx clear.  NECK:  Supple, no jugular venous distention. No thyroid enlargement, no tenderness.  LUNGS: Normal breath sounds bilaterally, no wheezing, rales, rhonchi. No use of accessory muscles of respiration.  CARDIOVASCULAR: S1, S2 normal.  No murmurs, rubs, or gallops.  ABDOMEN: Soft, nontender, nondistended. Bowel sounds present. No organomegaly or mass.  EXTREMITIES: No cyanosis, clubbing or edema b/l.    NEUROLOGIC: Cranial nerves II through XII are intact. No focal Motor or sensory deficits b/l.   PSYCHIATRIC: The patient is alert and oriented x 3.  SKIN: No obvious rash, lesion, or ulcer.   LABORATORY PANEL:  CBC  Recent Labs Lab 07/05/15 0517  WBC 7.7  HGB 13.5  HCT 40.2  PLT 232    Chemistries   Recent Labs Lab 07/07/15 0604  NA 137  K 4.3  CL 107  CO2 26  GLUCOSE 130*  BUN 28*  CREATININE 0.87  CALCIUM 8.9    Cardiac Enzymes No results for input(s): TROPONINI in the last 168 hours. 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 Headache. Lightheadedness and dizziness. Vomiting. Abnormal CT. Subsequent encounter. EXAM: MRI HEAD WITHOUT AND WITH CONTRAST TECHNIQUE: Multiplanar, multiecho pulse sequences of the brain and surrounding structures were obtained without and with intravenous contrast. CONTRAST:  39m MULTIHANCE GADOBENATE DIMEGLUMINE 529 MG/ML IV SOLN COMPARISON:  07/04/2015 and 09/08/2014 head CT. FINDINGS: Exam is motion degraded. Findings consistent with significant intracranial metastatic disease including: 4.5 x 4.2 x 4 cm necrotic irregular peripheral enhancing mass at the junction of the right temporal -parietal and frontal lobe with surrounding vasogenic edema compressing the atrium of the right lateral ventricle. Irregular necrotic tumor spanning over 6.2 x 5.8 x 4.8 cm extends from the right frontal lobe/ right caudate through the genu of the corpus callosum with irregular tumor along the periphery of the frontal horns of the lateral ventricles greater on left with  subependymal/ interventricular spread of tumor into the third ventricle/midbrain and superior aspect of the aqueduct. Tumor extension into the anterior aspect of  the third ventral also involves hypothalamus and optic chiasm. Vasogenic edema most notable surrounding the right frontal component with mass effect upon the right lateral ventricle with 1.3 mm midline shift to the left. Mild hydrocephalus with dilated temporal horns. 3 x 2.5 x 2.6 cm enhancing tumor fourth ventricle/ vermis with narrowing of the fourth ventricle. Beside small amount hemorrhage associated with the right frontal lobe necrotic tumor, no intracranial hemorrhage noted. Regions of restricted motion surround portions of the tumor without thrombotic infarct noted. Possible early metastatic lesion within the base of the dens. Cervical medullary junction new within normal limits. No primary orbital region abnormality noted. Major intracranial vascular structures appear to be patent. IMPRESSION: Exam is motion degraded. Findings consistent with significant intracranial metastatic disease including: 4.5 x 4.2 x 4 cm necrotic irregular peripheral enhancing mass at the junction of the right temporal -parietal and frontal lobe with surrounding vasogenic edema compressing the atrium of the right lateral ventricle. Irregular necrotic tumor spanning over 6.2 x 5.8 x 4.8 cm extends from the right frontal lobe/ right caudate through the genu of the corpus callosum with irregular tumor along the periphery of the frontal horns of the lateral ventricles greater on left with subependymal/ interventricular spread of tumor into the third ventricle/midbrain and superior aspect of the aqueduct. Tumor extension into the anterior aspect of the third ventral also involves hypothalamus and optic chiasm. Vasogenic edema most notable surrounding the right frontal component with mass effect upon the right lateral ventricle with 1.3 mm midline shift to the left. Mild hydrocephalus with dilated temporal horns. 3 x 2.5 x 2.6 cm enhancing tumor fourth ventricle/ vermis with narrowing of the fourth ventricle. Possible early metastatic  lesion within the base of the dens. Electronically Signed   By: Genia Del M.D.   On: 07/05/2015 11:54   ASSESSMENT AND PLAN:  Tammy Andersen is a 45 y.o. female with a known history of lung cancer. She presents with headache going on for weeks. She is also lightheaded dizzy and off balance with walking. She's also had some confusion and possible hallucinations  1. 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 Patient has midline shift and multiple lesions on CT scan. Overall prognosis is poor. Patient is a full code. Decadron 10 mg po qid for cerebral edema -Empiric Keppra to prevent seizure. Oncology and radiation oncology consultations appreciated. Pt to start brain radiation from monday jan 16th - MRI of the brain shows multiple necrotic metastatic lesions -pt advised to avoid taking tramadol since it decreases seizure threshold, advised to take prn tylenol and or ibuprofen  2. Nausea vomiting- secondary to brain metastases.prn nausea medications  3. Hypokalemia  -repleted  4. Unsteady gait- physical therapy recommends HHPT  5. Tobacco abuse- smoking cessation counseling done 3 minutes by me and nicotine patch ordered  6. Confusion and possible hallucinations- likely with brain metastases.pt appears to [facility] well  7. Hypothyroidism unspecified continue levothyroxine   Pt has overall poor prognosis D/c home with [facility] Pt has been seen by palliative care and it seems family does not realize the poor prognosis pt is carrying.  She remains a full code. She is hemodynamically stable for d/c however family thinks she needs to stay here in the hopsital for brain radiation. They would like to d/w drs finnegan and chrystal (paged) D/w dr Baruch Gouty and  he is ok  For pt to go home and do Radiation treatment as outpt.  Case discussed with Care Management/Social Worker. Management plans discussed with the patient, family and they are in agreement.  CODE STATUS:  full DVT Prophylaxis: lovenox  TOTAL TIME TAKING CARE OF THIS PATIENT: 35 minutes.  >50% time spent on counselling and coordination of care  Note: This dictation was prepared with Dragon dictation along with smaller phrase technology. Any transcriptional errors that result from this process are unintentional.  Junell Cullifer M.D on 07/06/2015 at 9:36 AM  Between 7am to 6pm - Pager - [phone]  After 6pm go to [email or web address] - password [email or web address] [facility]  [facility]  Office  [phone]  CC: Primary care physician; No primary care provider on file.

## 2015-07-07 NOTE — Plan of Care (Signed)
Problem: Education: Goal: Knowledge of [facility] Education information/materials will improve Outcome: Adequate for Discharge Discussed discharge plans with  pts son  Problem: Safety: Goal: Ability to remain free from injury will improve Outcome: Adequate for Discharge P.t. Worked with todat and pt oob to chair  Problem: Health Behavior/Discharge Planning: Goal: Ability to manage health-related needs will improve Outcome: Adequate for Discharge Cont on decadron po now. Confusion at times  Problem: Pain Managment: Goal: General experience of comfort will improve Outcome: Adequate for Discharge No c/o pain  Problem: Skin Integrity: Goal: Risk for impaired skin integrity will decrease Outcome: Adequate for Discharge No further  Skin issues  Problem: Activity: Goal: Risk for activity intolerance will decrease Outcome: Adequate for Discharge See above note

## 2015-07-07 NOTE — Care Management (Signed)
Spoke with son, Jeneen Rinks and Dr. Megan Salon.  Son has decided that home with Hospice Services is the discharge plan that he would like.  Telephone call to Flo Shanks, RN representative for [facility]. States she will talk to son about Hospice services. Requested standard wheelchair. Tammy Andersen has Lung cancer and metastases to the brain which impairs her ability to perform basic daily activities in the home, unable to ambulate 25 feet without assistance. A cane, walker, or crutch will not resolve these issues. A standard wheelchair will allow the patient to safely perform daily activities. Caregiver will be able to propel the standard wheelchair. Transportation will be arranged thru [employer]. Shelbie Ammons RN MSN CCM Care Management [phone]

## 2015-07-07 NOTE — Progress Notes (Signed)
Physical Therapy Treatment Patient Details Name: Tammy Andersen MRN: [id number] DOB: March 31, 1971 Today's Date: 07/07/2015    History of Present Illness Tammy Andersen is a 45 y.o. female with a known history of lung cancer. She presents with headache going on for weeks. She is also lightheaded dizzy and off balance with walking. Patient had CT scan which showed multiple metastatic lesions in brain with midline shift; This is likely due to small cell lung CA.     PT Comments    Patient with noted increase in level of confusion this date.  Significant decline in functional performance noted, as patient now requiring consistent mod assist +2 for ALL mobility, and remains very unstable/unsafe even with that.  Marked L inattention with absent insight into deficits and need for assist; extremely high risk for falls/injury given functional deficits. Did trial stairs secondary to concern over ability to enter/exit home--patient able to complete with mod assist +2 (bilat rails), but generally impulsive and unsafe.  Has functional strength to ascend/descend without buckling, but lacks balance/safety necessary to complete with less physical assist. Given noted decline in performance (and anticipated continued decline), question safety of discharge plan home with HHPT.  Mother planning to be primary caregiver, but question mother's full understanding of medical presentation and level of assist required upon discharge. Per chart, family choosing to pursue palliative radiation/improvement at this time; will trial continued PT services to facilitate functional improvement, but question rehab prognosis, as well as patient's ability to tolerate/participate/progress with skilled intervention.  Should patient/family become agreeable, may be more appropriate for transition to hospice care.  Will closely monitor, follow medical plan and modify PT plan as appropriate. Social worker, Transport planner, palliative care and  attending physician informed/aware of above.   Follow Up Recommendations  SNF (question ability to participate/progress/tolerate more intensive therapy at this time given medical prognosis; may be more appropriate for hospice services if family agreeable.  Will continue trial at this time per family wishes, but will closely monitor and adjust plan as appropriate)     Equipment Recommendations  Rolling walker with 5" wheels;Wheelchair (measurements PT);Wheelchair cushion (measurements PT);Hospital bed    Recommendations for Other Services       Precautions / Restrictions Precautions Precautions: Fall Restrictions Weight Bearing Restrictions: No    Mobility  Bed Mobility Overal bed mobility: Needs Assistance Bed Mobility: Supine to Sit     Supine to sit: Min assist     General bed mobility comments: assist to sequence and initiate movement transition; assist for truncal elevation and scooting towards edge of bed (transition towards L)  Transfers Overall transfer level: Needs assistance Equipment used: Rolling walker (2 wheeled) Transfers: Sit to/from Stand Sit to Stand: Mod assist;+2 physical assistance;+2 safety/equipment         General transfer comment: patient very impulsive; hand-over-hand assist and max verbal cuing required for L UE placement on RW.  Significant abdominal flaring, poor trunk control/stability noted  Ambulation/Gait Ambulation/Gait assistance: Mod assist;+2 physical assistance Ambulation Distance (Feet): 15 Feet Assistive device: Rolling walker (2 wheeled)       General Gait Details: step to gait pattern with very choppy, inconsistent step height/length and overall foot placement.  Broad BOS with very staggering steps.  Mod assist for walker management; constantly veers L without awareness or attempts at correction.  Very unsteady and unsafe; recommend short-distance attempts with no less than +2 at all times.   Stairs Stairs: Yes Stairs  assistance: +2 safety/equipment Stair Management: Two rails Number of  Stairs: 4 General stair comments: mixed step to vs. step through gait pattern; very impulsive; extremely unsafe and high fall risk  Wheelchair Mobility    Modified Rankin (Stroke Patients Only)       Balance Overall balance assessment: Needs assistance Sitting-balance support: No upper extremity supported;Feet supported Sitting balance-Leahy Scale: Fair Sitting balance - Comments: progressive L lateral lean with divided attention/fatigue   Standing balance support: Bilateral upper extremity supported Standing balance-Leahy Scale: Poor Standing balance comment: posterior trunk lean with significant abdominal flaring; poor core stabiltiy                    Cognition Arousal/Alertness: Lethargic Behavior During Therapy: Flat affect;Impulsive   Area of Impairment:  (oriented to self, location only; unaware of deficits and need for assist with ALL mobility)                    Exercises      General Comments        Pertinent Vitals/Pain Pain Assessment: Faces Pain Score: 4  Pain Location: headache Pain Descriptors / Indicators: Aching Pain Intervention(s): Limited activity within patient's tolerance;Monitored during session;Repositioned    Home Living                      Prior Function            PT Goals (current goals can now be found in the care plan section) Acute Rehab PT Goals PT Goal Formulation: Patient unable to participate in goal setting Time For Goal Achievement: 07/21/15 Potential to Achieve Goals: Poor Progress towards PT goals: Not progressing toward goals - comment (decline in functional performance due to progressive medical condition)    Frequency  Min 2X/week    PT Plan Discharge plan needs to be updated    Co-evaluation             End of Session Equipment Utilized During Treatment: Gait belt Activity Tolerance: Patient limited by  fatigue;Treatment limited secondary to medical complications (Comment) Patient left: with call bell/phone within reach;in chair;with chair alarm set;with family/visitor present     Time: 1059-1130 PT Time Calculation (min) (ACUTE ONLY): 31 min  Charges:  $Gait Training: 8-22 mins $Therapeutic Activity: 8-22 mins                    G Codes:      Dorris Vangorder H. Owens Shark, PT, DPT, NCS 07/07/2015, 4:22 PM [phone]

## 2015-07-07 NOTE — Progress Notes (Signed)
Patient seen and consulted for brain mets

## 2015-07-08 ENCOUNTER — Telehealth: Payer: Self-pay | Admitting: *Deleted

## 2015-07-08 DIAGNOSIS — C3490 Malignant neoplasm of unspecified part of unspecified bronchus or lung: Secondary | ICD-10-CM | POA: Diagnosis not present

## 2015-07-08 DIAGNOSIS — C7931 Secondary malignant neoplasm of brain: Secondary | ICD-10-CM | POA: Diagnosis not present

## 2015-07-08 NOTE — Telephone Encounter (Signed)
Being admitted to hospice home for End of Life Care DNR signed by Dr Harmon Pier

## 2015-07-11 ENCOUNTER — Ambulatory Visit: Payer: Medicaid Other

## 2015-07-11 ENCOUNTER — Ambulatory Visit
Admission: RE | Admit: 2015-07-11 | Discharge: 2015-07-11 | Disposition: A | Discharge status: Home / Self Care | Payer: Medicaid Other | Source: Ambulatory Visit | Attending: Radiation Oncology | Admitting: Radiation Oncology

## 2015-07-12 ENCOUNTER — Ambulatory Visit: Payer: Medicaid Other

## 2015-07-13 ENCOUNTER — Ambulatory Visit: Payer: Medicaid Other

## 2015-07-14 ENCOUNTER — Ambulatory Visit: Payer: Medicaid Other

## 2015-07-14 NOTE — Progress Notes (Signed)
  Oncology Nurse Navigator Documentation  Navigator Location: [facility] (07/14/15 1500)                 Barriers/Navigation Needs: Financial (07/14/15 1500)                          Time Spent with Patient: 30 (07/14/15 1500)   Received request for assistance with power and rent. Will assist with power. Documented that she was sent to hospice home 07/08/15 for end of life care. Will defer rent to PSN upon his return.

## 2015-07-15 ENCOUNTER — Ambulatory Visit: Payer: Medicaid Other

## 2015-07-18 ENCOUNTER — Ambulatory Visit: Payer: Medicaid Other

## 2015-07-19 ENCOUNTER — Ambulatory Visit: Payer: Medicaid Other

## 2015-07-20 ENCOUNTER — Ambulatory Visit: Payer: Medicaid Other

## 2015-07-21 ENCOUNTER — Ambulatory Visit: Payer: Medicaid Other

## 2015-07-22 ENCOUNTER — Ambulatory Visit: Payer: Medicaid Other

## 2015-07-25 ENCOUNTER — Other Ambulatory Visit: Payer: Self-pay | Admitting: Oncology

## 2015-07-27 DEATH — deceased

## 2015-11-23 IMAGING — CT NM PET TUM IMG RESTAG (PS) SKULL BASE T - THIGH
10 series · 24 of 25 positions shown · non-contrast
Comparison: CT chest from 08/07/2014.

CLINICAL DATA: Initial treatment strategy for stage IIIB small cell
lung carcinoma.

EXAM:
NUCLEAR MEDICINE PET SKULL BASE TO THIGH
TECHNIQUE: 13.2 mCi F-18 FDG was injected intravenously. Full-ring PET imaging
was performed from the skull base to thigh after the radiotracer. CT
data was obtained and used for attenuation correction and anatomic
localization.
FASTING BLOOD GLUCOSE:  Value: 91 mg/dl

[Series 3: ct wb 5.0 b30f · axial · 5.0mm · 0.98mm/px · z∈[-1239,-372]mm · 3 of 290 slices shown]
[im 1/290]
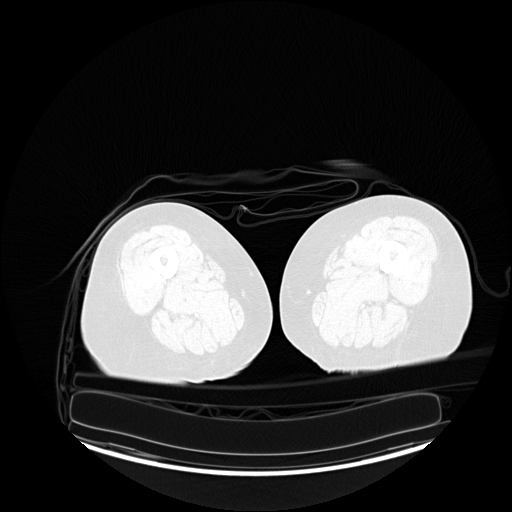
[im 145/290]
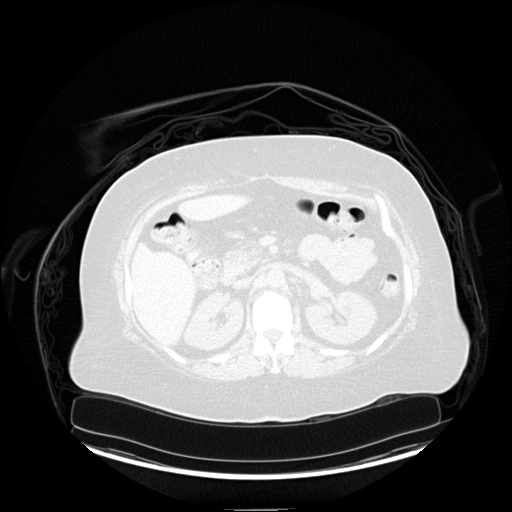
[im 290/290  brain]
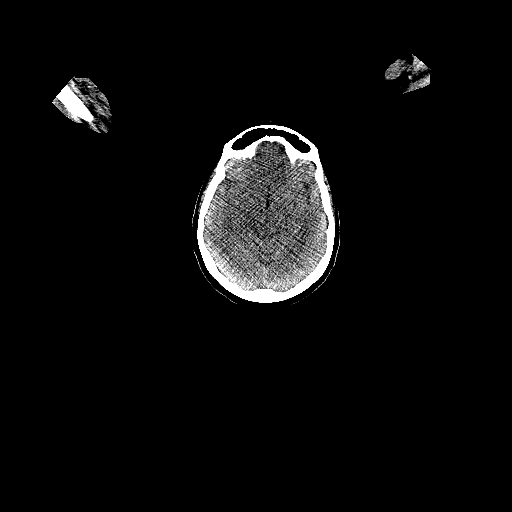

[Series 4: pet wb (ac) · axial · 5.0mm · 4.07mm/px · z∈[-1239,-372]mm · 3 of 290 slices shown]
[im 1/290]
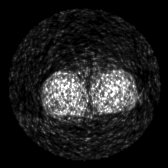
[im 145/290]
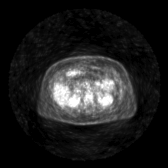
[im 290/290]
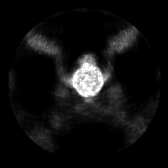

[Series 5: pet wb uncorrected (nac) · axial · 5.0mm · 4.07mm/px · z∈[-1239,-372]mm · 4 of 290 slices shown]
[im 1/290]
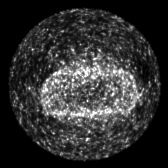
[im 97/290]
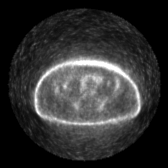
[im 193/290]
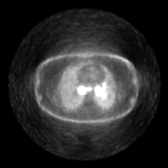
[im 290/290]
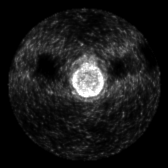

[Series 603: pet/ct axial · 3 of 288 slices shown]
[im 1/288]
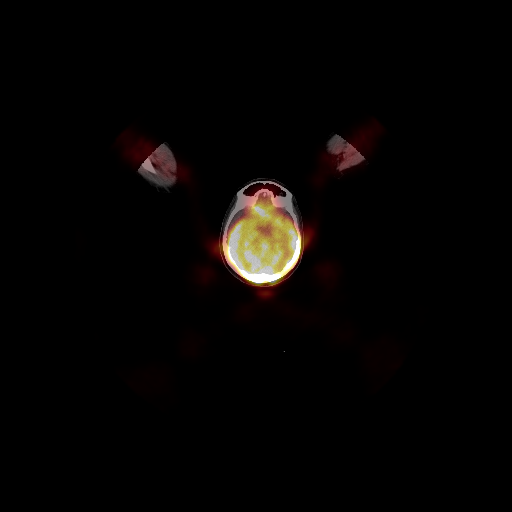
[im 96/288]
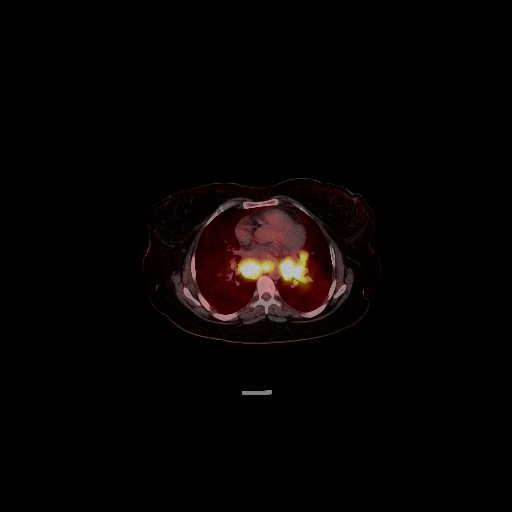
[im 288/288]
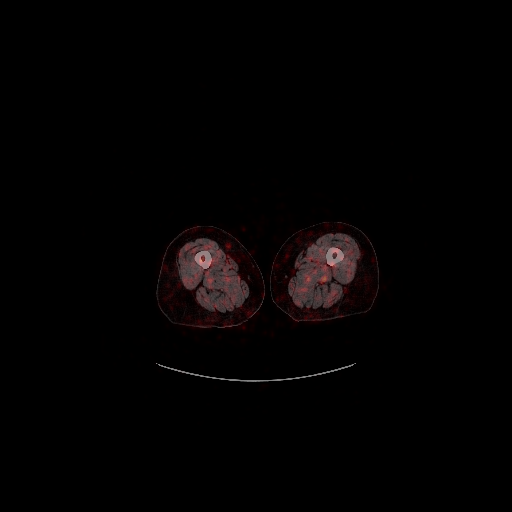

[Series 604: pet/ct sagittal · 2 of 146 slices shown]
[im 1/146]
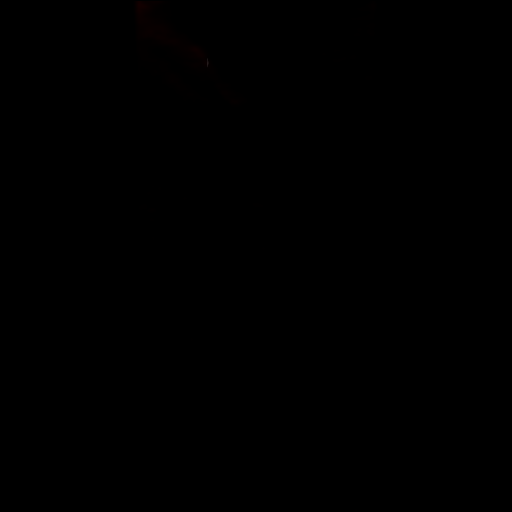
[im 146/146]
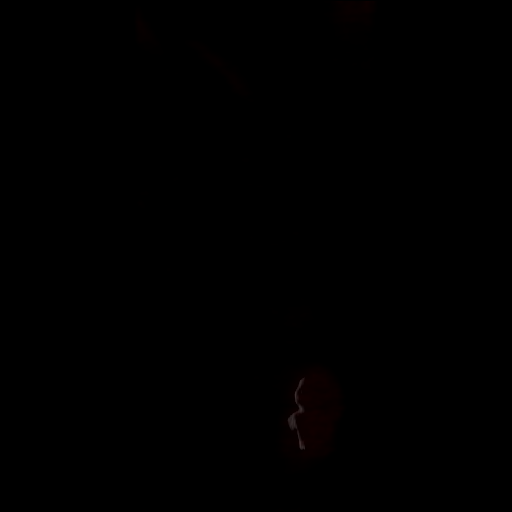

[Series 605: pet/ct coronal · 1 of 119 slices shown]
[im 1/119]
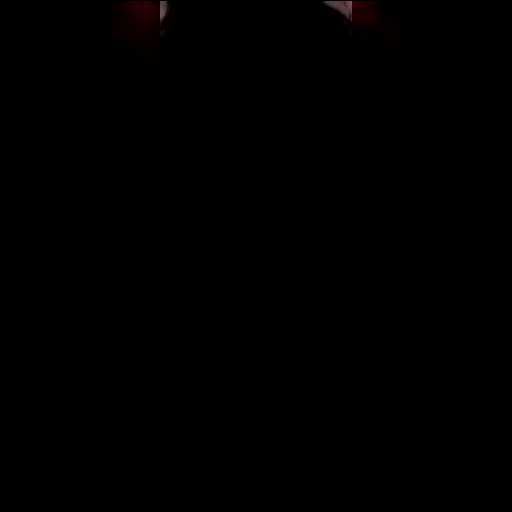

[Series 606: pet axial · 4 of 290 slices shown]
[im 1/290]
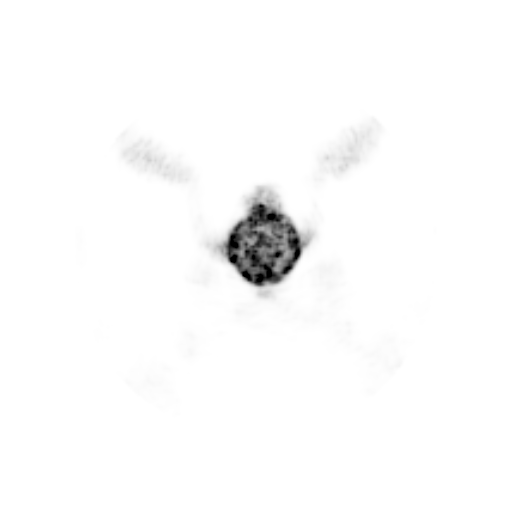
[im 97/290]
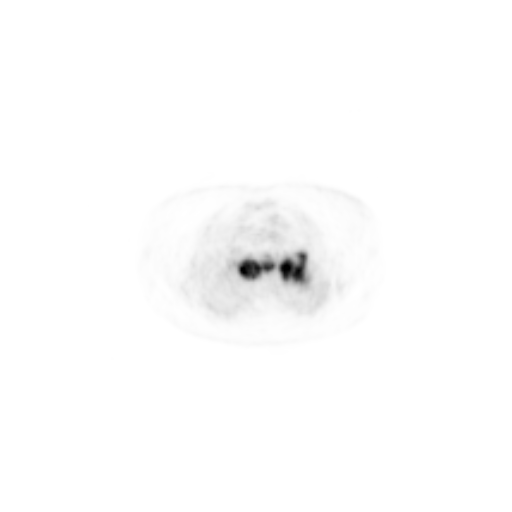
[im 193/290]
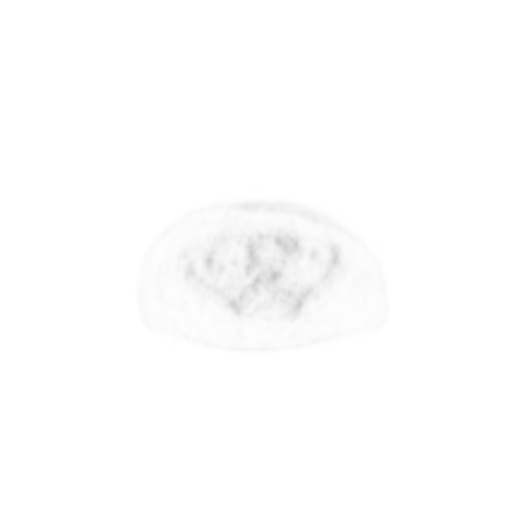
[im 290/290]
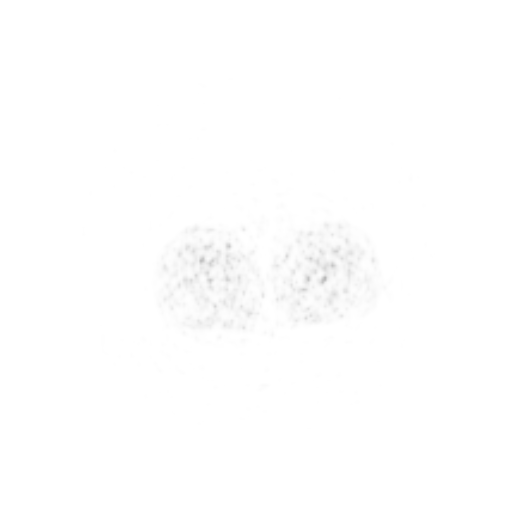

[Series 607: pet coronal · 1 of 110 slices shown]
[im 1/110]
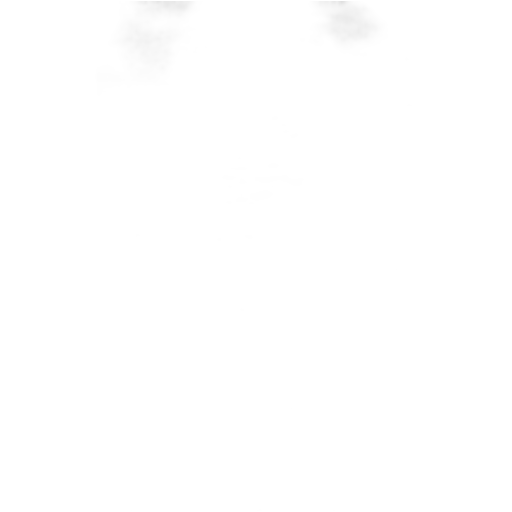

[Series 608: pet sagittal · 2 of 178 slices shown]
[im 1/178]
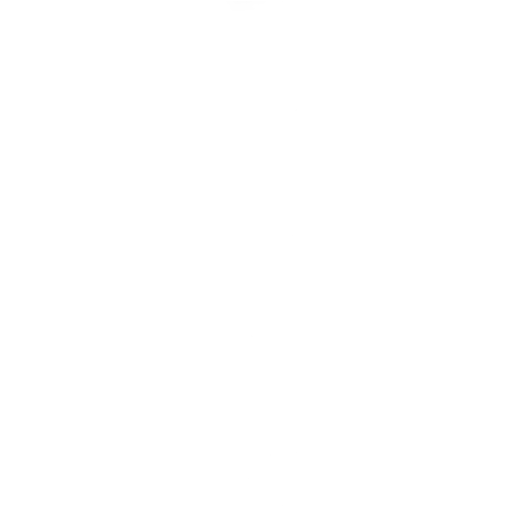
[im 178/178]
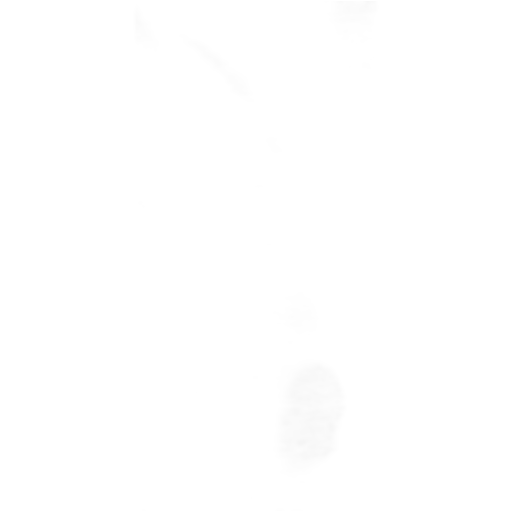

[Series 1046: results mm oncology reading · 5.0mm · 0.97mm/px · 1 of 4 slices shown]
[im 1/4]
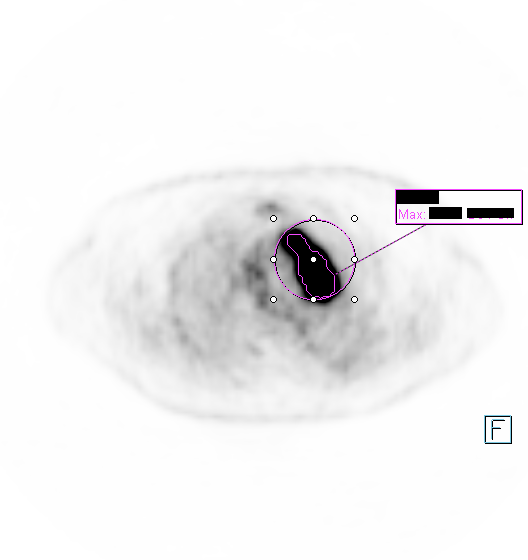

[24 of 25 positions shown; findings below may reference images not displayed]

FINDINGS: NECK

There is no hypermetabolic lymphadenopathy in the neck. Diffuse FDG
uptake is identified in both lobes of the thyroid gland may be
related to thyroiditis.

CHEST

Previously described bulky mediastinal and left hilar
lymphadenopathy is markedly hypermetabolic. Representative SUV for
anterior mediastinal lymphadenopathy is 23.9. Subcarinal
lymphadenopathy demonstrates SUV max = 15.9. Left infrahilar disease
demonstrates SUV max = 14.9.

ABDOMEN/PELVIS

1.7 cm left adrenal nodule is markedly hypermetabolic with SUV max =
9.2. Right adrenal gland is normal. Mild fold FDG uptake is
identified in the liver parenchyma, but no discrete or highly
suspicious focal liver lesion is evident. No other evidence for
visceral metastases within the abdomen.

Atherosclerotic calcification is seen in the wall of the abdominal
aorta without aneurysm.

5.4 x 4.6 cm cystic mass identified in the left adnexal space
demonstrates no hypermetabolism on PET imaging.

SKELETON

No focal hypermetabolic activity to suggest skeletal metastasis.
IMPRESSION: Markedly hypermetabolic lymphadenopathy of the mediastinum and hilum
consistent with the patient's known malignancy. There is associated
hypermetabolic 1.7 cm left adrenal nodule, compatible with
metastatic disease to the abdomen.

5.4 cm left adnexal cystic lesion without hypermetabolism. Pelvic
ultrasound may prove helpful to further evaluate.

## 2016-08-15 IMAGING — CT CT ABD-PELV W/ CM
2 of 5 series · 14 of 46 positions shown, 16 images · IV contrast (omnipaque)
Comparison: 02/03/2015

CLINICAL DATA: Stage IV small cell lung cancer with adrenal
metastatic lesion.

EXAM:
CT CHEST, ABDOMEN, AND PELVIS WITH CONTRAST
TECHNIQUE: Multidetector CT imaging of the chest, abdomen and pelvis was
performed following the standard protocol during bolus
administration of intravenous contrast.
CONTRAST:  100mL OMNIPAQUE IOHEXOL 300 MG/ML  SOLN

[Series 2: cap with · axial · 0.73mm/px · z∈[-852,-312]mm · 11 of 122 slices shown, 13 images]
[im 7/122  soft-tissue]
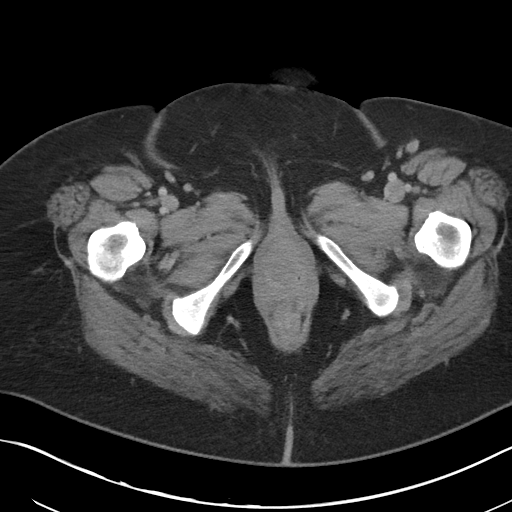
[im 7/122  bone]
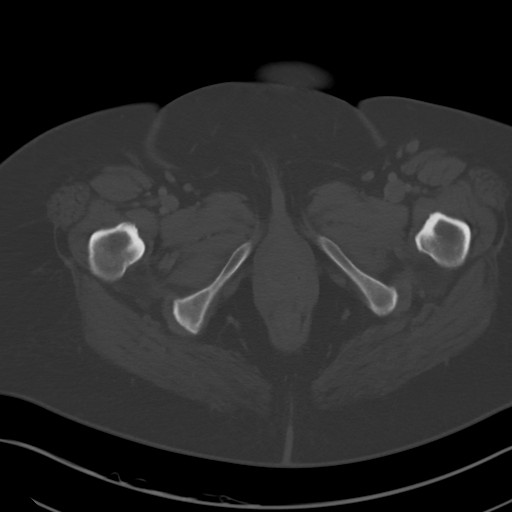
[im 21/122  soft-tissue]
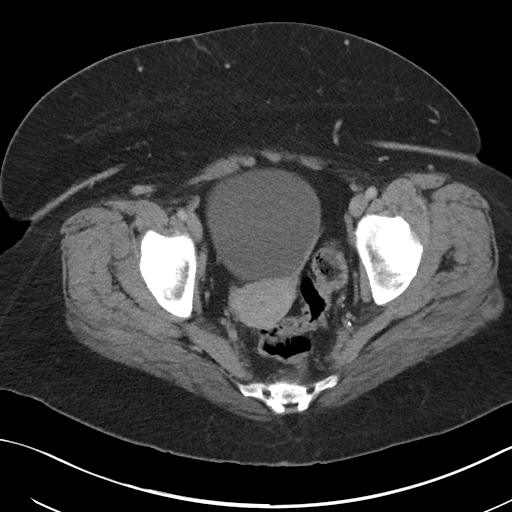
[im 27/122  soft-tissue]
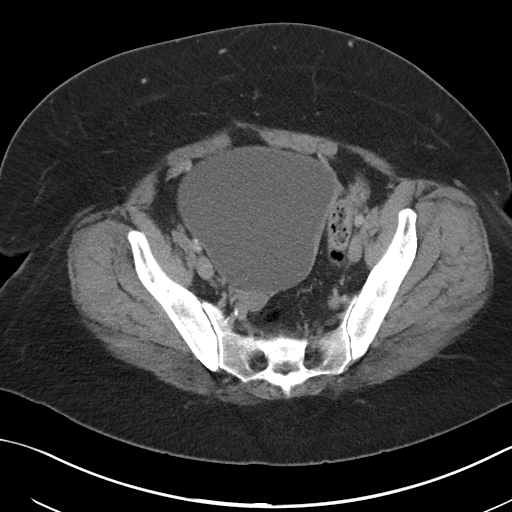
[im 41/122  soft-tissue]
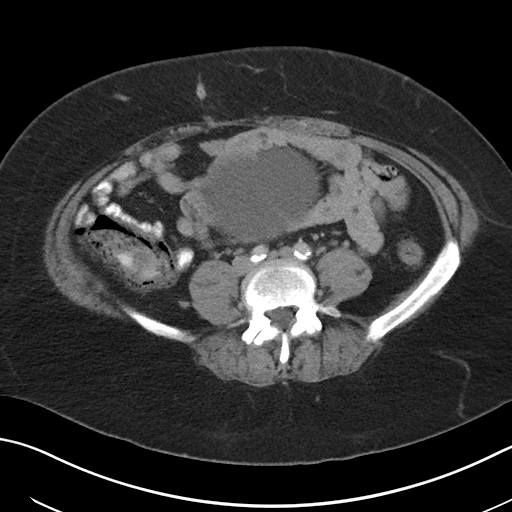
[im 48/122  soft-tissue]
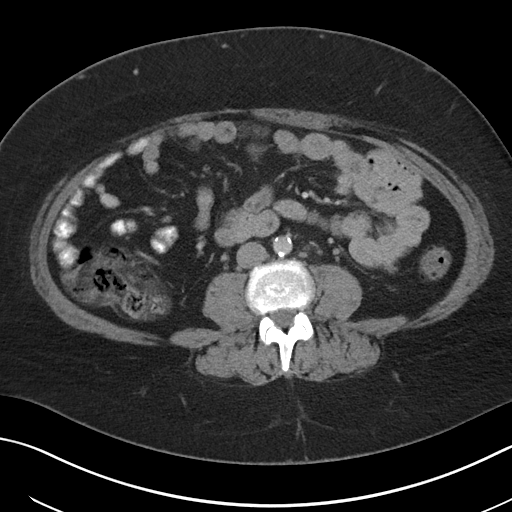
[im 61/122  soft-tissue]
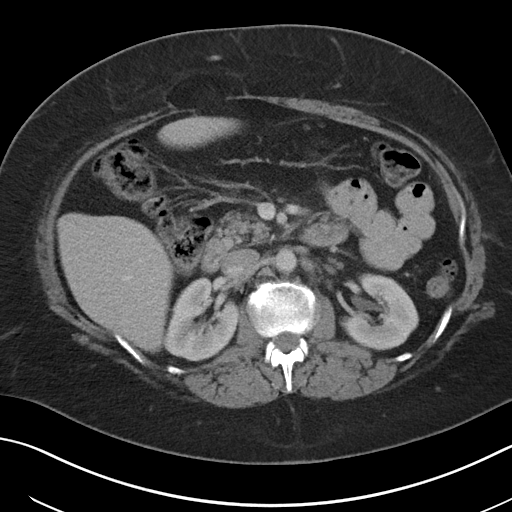
[im 74/122  soft-tissue]
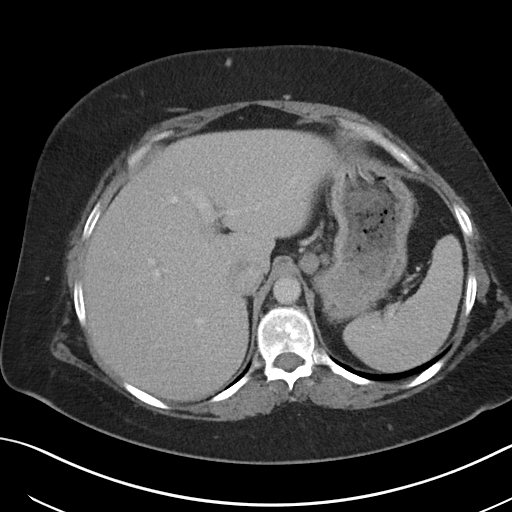
[im 81/122  soft-tissue]
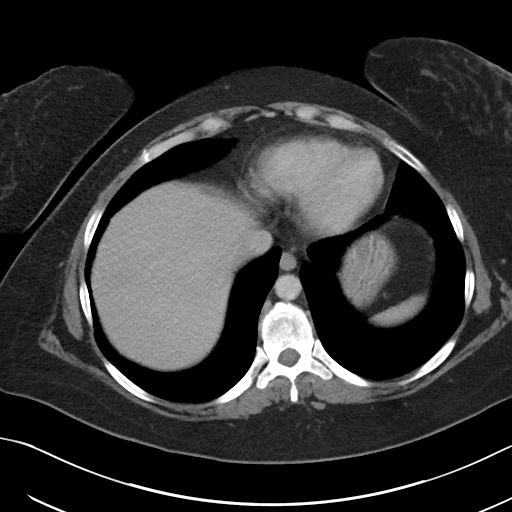
[im 95/122  soft-tissue]
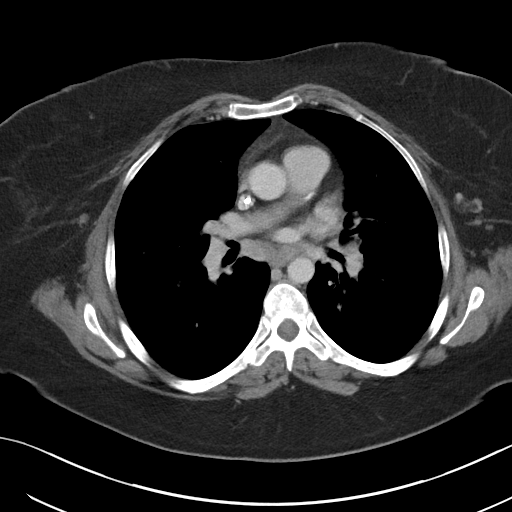
[im 95/122  bone]
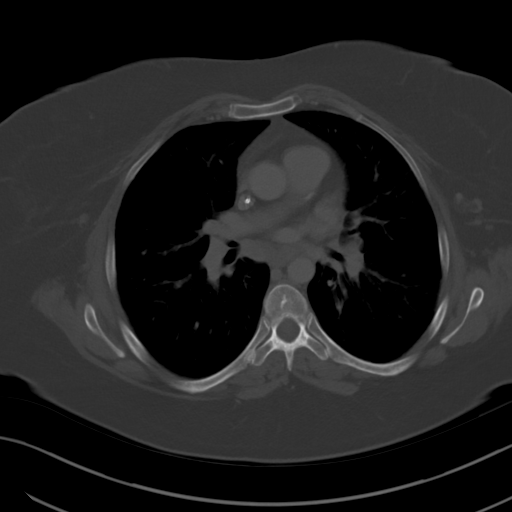
[im 101/122  soft-tissue]
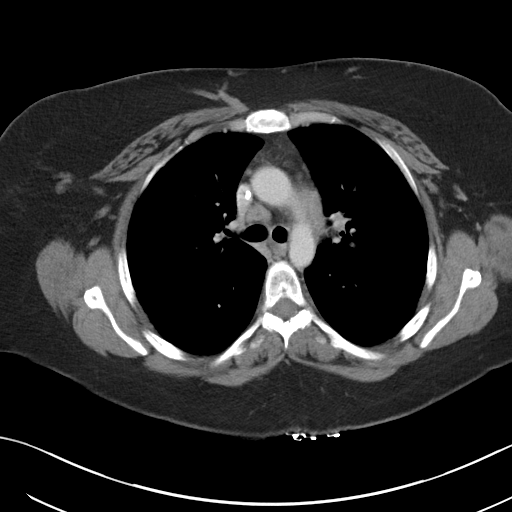
[im 115/122  soft-tissue]
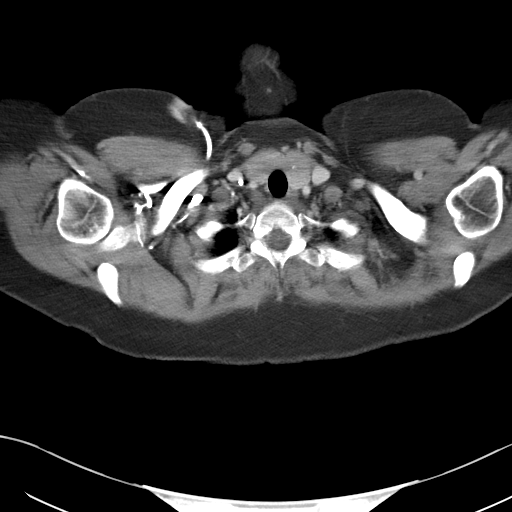

[Series 5: cor cap with · coronal · 0.76mm/px · 3 of 158 slices shown]
[im 53/158  soft-tissue]
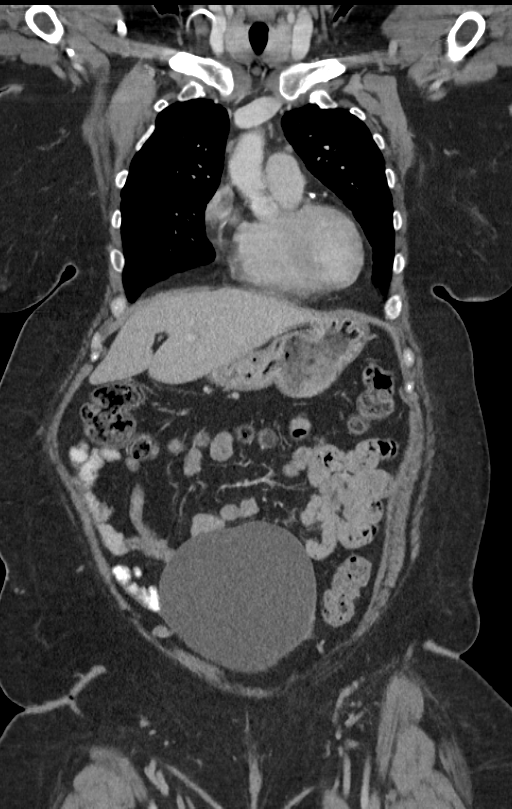
[im 70/158  soft-tissue]
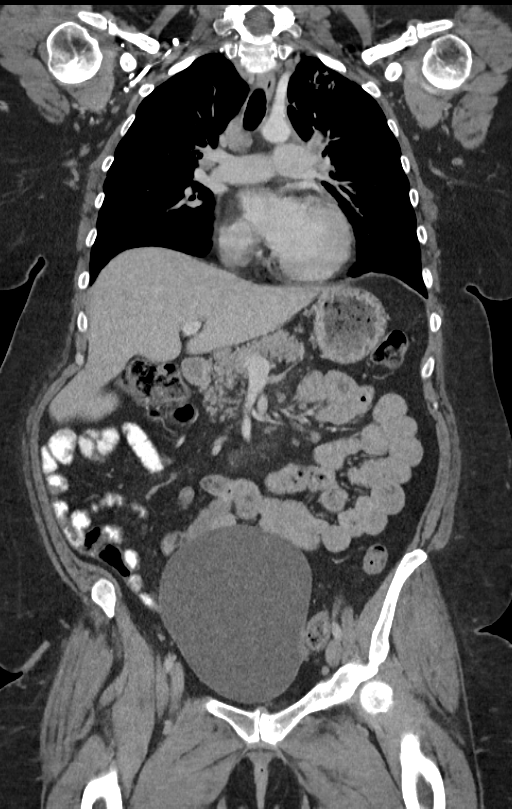
[im 88/158  soft-tissue]
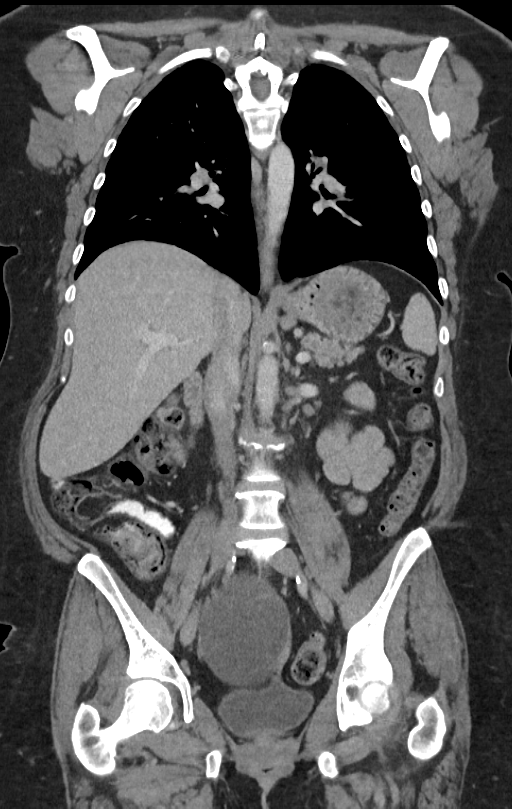

[14 of 46 positions shown; findings below may reference images not displayed]

FINDINGS: CT CHEST FINDINGS

Mediastinum/Nodes: Right upper paratracheal node 1.5 cm in short
axis on image 10 series 2, formerly 0.9 cm.

Right lower paratracheal node 1.6 cm in short axis on image 21
series 2, formerly 0.8 cm.

AP window lymph node 1.0 cm in short axis on image 22 series 2,
previously 0.9 cm.

Subcarinal node 1.3 cm in short axis on image 28 series 2, formerly
1.1 cm.

Left infrahilar node 1.2 cm in short axis on image 27 series 2,
formerly the same.

Left anterior descending coronary artery atherosclerosis.

Lungs/Pleura: Pattern of left upper lobe branching flat density
stable from prior. Mosaic attenuation similar to prior, likely from
chronic pulmonary embolus or small airways disease.

Musculoskeletal: Unremarkable

CT ABDOMEN PELVIS FINDINGS

Hepatobiliary: Cholecystectomy

Pancreas: Unremarkable

Spleen: Unremarkable

Adrenals/Urinary Tract: Unremarkable

Stomach/Bowel: Gas outlines appearance stool in the cecum this
unlikely to represent pneumatosis.

Vascular/Lymphatic: Aortoiliac atherosclerotic vascular disease. A
gastrohepatic ligament node 1.4 cm in short axis, stable by my
measurements. Scattered small retroperitoneal lymph nodes.

Reproductive: 10.6 by 12.7 by 14.8 cm cystic lesion with internal
septation believed to be associated with the left ovary although
mainly in the midline. Uterus unremarkable. Questionable nodularity
along the cystic mass.

Other: No supplemental non-categorized findings.

Musculoskeletal: Right paracentral ventral hernia contains adipose
tissue, image 62 series 2, similar to prior.

Note is made of a left unilateral pars defect at L5. There is mild
right foraminal stenosis at L4-5 due to a right foraminal disc
protrusion.
IMPRESSION: 1. Increasing adenopathy in the mediastinum compatible with
malignancy. Stable abnormally enlarged gastrohepatic lymph node.
2. Previous 5.4 cm left adnexal cystic lesion has significantly
enlarged, currently measuring 14.8 by 10.6 by 12.7 cm, with internal
septation noted. Cystic ovarian malignancy is not excluded. Pelvic
sonography recommended.
3. Other imaging findings of potential clinical significance: Left
upper lobe scarring similar to prior. Mosaic attenuation similar to
prior, possibly from small airways disease or chronic pulmonary
embolus. Left unilateral pars defect at L5. Right foraminal stenosis
at L4-5 due to right foraminal disc protrusion. Stable right
paracentral ventral hernia. Aortoiliac atherosclerotic vascular
disease. Left anterior descending coronary artery atherosclerosis.
